# Patient Record
Sex: Female | Born: 1955 | Race: Black or African American | Hispanic: No
Health system: Southern US, Community
[De-identification: ages and names within clinical notes are randomized; demographics above are authoritative.]

## PROBLEM LIST (undated history)

## (undated) DIAGNOSIS — J45909 Unspecified asthma, uncomplicated: Secondary | ICD-10-CM

## (undated) DIAGNOSIS — I10 Essential (primary) hypertension: Secondary | ICD-10-CM

## (undated) DIAGNOSIS — G629 Polyneuropathy, unspecified: Secondary | ICD-10-CM

## (undated) DIAGNOSIS — M4316 Spondylolisthesis, lumbar region: Secondary | ICD-10-CM

## (undated) HISTORY — PX: OTHER SURGICAL HISTORY: SHX169

---

## 2005-01-16 ENCOUNTER — Ambulatory Visit (HOSPITAL_COMMUNITY): Admission: RE | Admit: 2005-01-16 | Discharge: 2005-01-16 | Payer: Self-pay | Admitting: Family Medicine

## 2007-11-02 ENCOUNTER — Emergency Department (HOSPITAL_COMMUNITY): Admission: EM | Admit: 2007-11-02 | Discharge: 2007-11-02 | Payer: Self-pay | Admitting: Emergency Medicine

## 2011-02-18 ENCOUNTER — Other Ambulatory Visit (HOSPITAL_COMMUNITY): Payer: Self-pay | Admitting: Family Medicine

## 2011-02-18 DIAGNOSIS — Z139 Encounter for screening, unspecified: Secondary | ICD-10-CM

## 2011-02-25 ENCOUNTER — Ambulatory Visit (HOSPITAL_COMMUNITY)
Admission: RE | Admit: 2011-02-25 | Discharge: 2011-02-25 | Disposition: A | Discharge status: Home / Self Care | Payer: BC Managed Care – PPO | Source: Ambulatory Visit | Attending: Family Medicine | Admitting: Family Medicine

## 2011-02-25 DIAGNOSIS — Z1231 Encounter for screening mammogram for malignant neoplasm of breast: Secondary | ICD-10-CM | POA: Insufficient documentation

## 2011-02-25 DIAGNOSIS — Z139 Encounter for screening, unspecified: Secondary | ICD-10-CM

## 2011-02-26 ENCOUNTER — Ambulatory Visit (HOSPITAL_COMMUNITY): Payer: Self-pay

## 2012-03-24 ENCOUNTER — Other Ambulatory Visit (HOSPITAL_COMMUNITY): Payer: Self-pay | Admitting: Family Medicine

## 2012-03-24 DIAGNOSIS — Z139 Encounter for screening, unspecified: Secondary | ICD-10-CM

## 2012-03-30 ENCOUNTER — Ambulatory Visit (HOSPITAL_COMMUNITY): Payer: BC Managed Care – PPO

## 2012-04-03 ENCOUNTER — Ambulatory Visit (HOSPITAL_COMMUNITY)
Admission: RE | Admit: 2012-04-03 | Discharge: 2012-04-03 | Disposition: A | Discharge status: Home / Self Care | Payer: BC Managed Care – PPO | Source: Ambulatory Visit | Attending: Family Medicine | Admitting: Family Medicine

## 2012-04-03 DIAGNOSIS — Z1231 Encounter for screening mammogram for malignant neoplasm of breast: Secondary | ICD-10-CM | POA: Insufficient documentation

## 2012-04-03 DIAGNOSIS — Z139 Encounter for screening, unspecified: Secondary | ICD-10-CM

## 2012-06-23 ENCOUNTER — Other Ambulatory Visit (HOSPITAL_COMMUNITY): Payer: Self-pay | Admitting: Family Medicine

## 2012-06-23 ENCOUNTER — Ambulatory Visit (HOSPITAL_COMMUNITY)
Admission: RE | Admit: 2012-06-23 | Discharge: 2012-06-23 | Disposition: A | Discharge status: Home / Self Care | Payer: BC Managed Care – PPO | Source: Ambulatory Visit | Attending: Family Medicine | Admitting: Family Medicine

## 2012-06-23 DIAGNOSIS — M25549 Pain in joints of unspecified hand: Secondary | ICD-10-CM | POA: Insufficient documentation

## 2012-06-23 DIAGNOSIS — R609 Edema, unspecified: Secondary | ICD-10-CM

## 2012-06-23 DIAGNOSIS — M25649 Stiffness of unspecified hand, not elsewhere classified: Secondary | ICD-10-CM | POA: Insufficient documentation

## 2013-03-23 ENCOUNTER — Other Ambulatory Visit (HOSPITAL_COMMUNITY): Payer: Self-pay | Admitting: Family Medicine

## 2013-03-23 DIAGNOSIS — Z139 Encounter for screening, unspecified: Secondary | ICD-10-CM

## 2013-04-05 ENCOUNTER — Ambulatory Visit (HOSPITAL_COMMUNITY)
Admission: RE | Admit: 2013-04-05 | Discharge: 2013-04-05 | Disposition: A | Discharge status: Home / Self Care | Payer: BC Managed Care – PPO | Source: Ambulatory Visit | Attending: Family Medicine | Admitting: Family Medicine

## 2013-04-05 DIAGNOSIS — Z1231 Encounter for screening mammogram for malignant neoplasm of breast: Secondary | ICD-10-CM | POA: Insufficient documentation

## 2013-04-05 DIAGNOSIS — Z139 Encounter for screening, unspecified: Secondary | ICD-10-CM

## 2016-11-11 ENCOUNTER — Telehealth: Payer: Self-pay

## 2016-11-11 NOTE — Telephone Encounter (Signed)
[phone]  PATIENT CALLED TO SCHEDULE A TCS L/M IF SHE DOES NOT ANSWER

## 2016-11-14 ENCOUNTER — Telehealth: Payer: Self-pay

## 2016-11-14 NOTE — Telephone Encounter (Signed)
LMOM to call.

## 2016-11-14 NOTE — Telephone Encounter (Signed)
See separate triage.  

## 2016-11-25 NOTE — Telephone Encounter (Signed)
Appropriate.

## 2016-11-25 NOTE — Telephone Encounter (Signed)
Gastroenterology Pre-Procedure Review  Request Date: 11/14/2016 Requesting Physician: Dr. Selena BattenKim   PATIENT REVIEW QUESTIONS: The patient responded to the following health history questions as indicated:    1. Diabetes Melitis: no 2. Joint replacements in the past 12 months: no 3. Major health problems in the past 3 months: no 4. Has an artificial valve or MVP: YES 5. Has a defibrillator: no 6. Has been advised in past to take antibiotics in advance of a procedure like teeth cleaning: no 7. Family history of colon cancer: no  8. Alcohol Use: no 9. History of sleep apnea: no  10. History of coronary artery or other vascular stents placed within the last 12 months: no    MEDICATIONS & ALLERGIES:    Patient reports the following regarding taking any blood thinners:   Plavix? no Aspirin? no Coumadin? no Brilinta? no Xarelto? no Eliquis? no Pradaxa? no Savaysa? no Effient? no  Patient confirms/reports the following medications:  Current Outpatient Prescriptions  Medication Sig Dispense Refill  . amLODipine (NORVASC) 5 MG tablet Take 5 mg by mouth daily.    . furosemide (LASIX) 20 MG tablet Take 20 mg by mouth.    Marland Kitchen. lisinopril (PRINIVIL,ZESTRIL) 40 MG tablet Take 40 mg by mouth daily.     No current facility-administered medications for this visit.     Patient confirms/reports the following allergies:  No Known Allergies  No orders of the defined types were placed in this encounter.   AUTHORIZATION INFORMATION Primary Insurance:  ID #: Group #:  Pre-Cert / Auth required: Pre-Cert / Auth #:   Secondary Insurance:  ID #:   Group #:  Pre-Cert / Auth required:  Pre-Cert / Auth #:   SCHEDULE INFORMATION: Procedure has been scheduled as follows:  Date:                        Time:   Location:   This Gastroenterology Pre-Precedure Review Form is being routed to the following provider(s): R. Roetta SessionsMichael Rourk, MD

## 2016-11-26 ENCOUNTER — Other Ambulatory Visit: Payer: Self-pay

## 2016-11-26 NOTE — Telephone Encounter (Signed)
LMOM to call back

## 2016-11-26 NOTE — Telephone Encounter (Signed)
Forwarding to Ginger to complete.

## 2016-11-27 NOTE — Telephone Encounter (Signed)
Pt called back but she does not have her schedule for April so she will call us back tomorrow

## 2016-11-28 ENCOUNTER — Other Ambulatory Visit: Payer: Self-pay

## 2016-11-28 DIAGNOSIS — Z1211 Encounter for screening for malignant neoplasm of colon: Secondary | ICD-10-CM

## 2016-11-28 MED ORDER — PEG 3350-KCL-NA BICARB-NACL 420 G PO SOLR: 4000.0000 mL | ORAL | 0 refills | Status: DC

## 2016-11-28 NOTE — Telephone Encounter (Signed)
Pt is set up for TCS on 01/16/17 @ 12:00 pm. She is aware of appointment. NO PA is needed

## 2016-12-04 ENCOUNTER — Ambulatory Visit (INDEPENDENT_AMBULATORY_CARE_PROVIDER_SITE_OTHER): Payer: BLUE CROSS/BLUE SHIELD | Admitting: Urology

## 2016-12-04 DIAGNOSIS — R1084 Generalized abdominal pain: Secondary | ICD-10-CM

## 2016-12-06 ENCOUNTER — Other Ambulatory Visit (HOSPITAL_COMMUNITY): Payer: Self-pay | Admitting: Internal Medicine

## 2016-12-06 DIAGNOSIS — R109 Unspecified abdominal pain: Secondary | ICD-10-CM

## 2016-12-11 ENCOUNTER — Ambulatory Visit (HOSPITAL_COMMUNITY)
Admission: RE | Admit: 2016-12-11 | Discharge: 2016-12-11 | Disposition: A | Discharge status: Home / Self Care | Payer: BLUE CROSS/BLUE SHIELD | Source: Ambulatory Visit | Attending: Internal Medicine | Admitting: Internal Medicine

## 2016-12-11 DIAGNOSIS — N1330 Unspecified hydronephrosis: Secondary | ICD-10-CM | POA: Diagnosis not present

## 2016-12-11 DIAGNOSIS — R109 Unspecified abdominal pain: Secondary | ICD-10-CM | POA: Insufficient documentation

## 2017-01-08 ENCOUNTER — Ambulatory Visit (INDEPENDENT_AMBULATORY_CARE_PROVIDER_SITE_OTHER): Payer: BLUE CROSS/BLUE SHIELD | Admitting: Urology

## 2017-01-08 ENCOUNTER — Ambulatory Visit (HOSPITAL_COMMUNITY)
Admission: RE | Admit: 2017-01-08 | Discharge: 2017-01-08 | Disposition: A | Discharge status: Home / Self Care | Payer: BLUE CROSS/BLUE SHIELD | Source: Ambulatory Visit | Attending: Urology | Admitting: Urology

## 2017-01-08 ENCOUNTER — Other Ambulatory Visit: Payer: Self-pay | Admitting: Urology

## 2017-01-08 DIAGNOSIS — N1339 Other hydronephrosis: Secondary | ICD-10-CM

## 2017-01-08 DIAGNOSIS — K769 Liver disease, unspecified: Secondary | ICD-10-CM | POA: Insufficient documentation

## 2017-01-08 DIAGNOSIS — R109 Unspecified abdominal pain: Secondary | ICD-10-CM | POA: Diagnosis not present

## 2017-01-08 DIAGNOSIS — R1084 Generalized abdominal pain: Secondary | ICD-10-CM | POA: Diagnosis not present

## 2017-01-14 ENCOUNTER — Telehealth: Payer: Self-pay

## 2017-01-14 NOTE — Telephone Encounter (Signed)
No changes in her medications for up coming TCS.

## 2017-01-14 NOTE — Telephone Encounter (Signed)
LMOM to call back

## 2017-01-16 ENCOUNTER — Encounter (HOSPITAL_COMMUNITY): Payer: Self-pay | Admitting: *Deleted

## 2017-01-16 ENCOUNTER — Ambulatory Visit (HOSPITAL_COMMUNITY)
Admission: RE | Admit: 2017-01-16 | Discharge: 2017-01-16 | Disposition: A | Discharge status: Home / Self Care | Payer: BLUE CROSS/BLUE SHIELD | Source: Ambulatory Visit | Attending: Internal Medicine | Admitting: Internal Medicine

## 2017-01-16 ENCOUNTER — Encounter (HOSPITAL_COMMUNITY)
Admission: RE | Disposition: A | Discharge status: Home / Self Care | Payer: Self-pay | Source: Ambulatory Visit | Attending: Internal Medicine

## 2017-01-16 DIAGNOSIS — K5730 Diverticulosis of large intestine without perforation or abscess without bleeding: Secondary | ICD-10-CM | POA: Insufficient documentation

## 2017-01-16 DIAGNOSIS — Z7982 Long term (current) use of aspirin: Secondary | ICD-10-CM | POA: Insufficient documentation

## 2017-01-16 DIAGNOSIS — Z79899 Other long term (current) drug therapy: Secondary | ICD-10-CM | POA: Diagnosis not present

## 2017-01-16 DIAGNOSIS — Z800 Family history of malignant neoplasm of digestive organs: Secondary | ICD-10-CM | POA: Insufficient documentation

## 2017-01-16 DIAGNOSIS — Z1212 Encounter for screening for malignant neoplasm of rectum: Secondary | ICD-10-CM | POA: Diagnosis not present

## 2017-01-16 DIAGNOSIS — I10 Essential (primary) hypertension: Secondary | ICD-10-CM | POA: Diagnosis not present

## 2017-01-16 DIAGNOSIS — Z1211 Encounter for screening for malignant neoplasm of colon: Secondary | ICD-10-CM | POA: Insufficient documentation

## 2017-01-16 DIAGNOSIS — J45909 Unspecified asthma, uncomplicated: Secondary | ICD-10-CM | POA: Diagnosis not present

## 2017-01-16 HISTORY — PX: COLONOSCOPY: SHX5424

## 2017-01-16 HISTORY — DX: Essential (primary) hypertension: I10

## 2017-01-16 HISTORY — DX: Unspecified asthma, uncomplicated: J45.909

## 2017-01-16 SURGERY — COLONOSCOPY
Anesthesia: Moderate Sedation

## 2017-01-16 MED ORDER — MEPERIDINE HCL 100 MG/ML IJ SOLN
INTRAMUSCULAR | Status: AC
  Filled 2017-01-16: qty 2

## 2017-01-16 MED ORDER — MIDAZOLAM HCL 5 MG/5ML IJ SOLN
INTRAMUSCULAR | Status: DC | PRN
  Administered 2017-01-16: 2 mg via INTRAVENOUS
  Administered 2017-01-16: 1 mg via INTRAVENOUS
  Administered 2017-01-16: 2 mg via INTRAVENOUS

## 2017-01-16 MED ORDER — SODIUM CHLORIDE 0.9 % IV SOLN
INTRAVENOUS | Status: DC
  Administered 2017-01-16: 11:00:00 via INTRAVENOUS

## 2017-01-16 MED ORDER — MEPERIDINE HCL 100 MG/ML IJ SOLN
INTRAMUSCULAR | Status: DC | PRN
  Administered 2017-01-16: 50 mg via INTRAVENOUS
  Administered 2017-01-16: 25 mg via INTRAVENOUS
  Administered 2017-01-16: 50 mg via INTRAVENOUS

## 2017-01-16 MED ORDER — ONDANSETRON HCL 4 MG/2ML IJ SOLN
INTRAMUSCULAR | Status: AC
  Filled 2017-01-16: qty 2

## 2017-01-16 MED ORDER — STERILE WATER FOR IRRIGATION IR SOLN
Status: DC | PRN
  Administered 2017-01-16: 12:00:00

## 2017-01-16 MED ORDER — MIDAZOLAM HCL 5 MG/5ML IJ SOLN
INTRAMUSCULAR | Status: DC
Start: 2017-01-16 — End: 2017-01-16
  Filled 2017-01-16: qty 10

## 2017-01-16 MED ORDER — ONDANSETRON HCL 4 MG/2ML IJ SOLN
INTRAMUSCULAR | Status: DC | PRN
  Administered 2017-01-16: 4 mg via INTRAVENOUS

## 2017-01-16 NOTE — Discharge Instructions (Addendum)

## 2017-01-16 NOTE — Op Note (Signed)
[facility] Patient Name: Sarah Woodward Procedure Date: 01/16/2017 11:25 AM MRN: [id number] Date of Birth: 09/21/1957 Attending MD: Gennette Pac , MD CSN: [id number] Age: 61 Admit Type: Outpatient Procedure:                Colonoscopy Indications:              Screening for colorectal malignant neoplasm Providers:                Gennette Pac, MD, Criselda Peaches. Patsy Lager, RN,                            Burke Keels, Technician Referring MD:              Medicines:                Midazolam 5 mg IV, Meperidine 125 mg IV Complications:            No immediate complications. Estimated Blood Loss:     Estimated blood loss: none. Procedure:                Pre-Anesthesia Assessment:                           - Prior to the procedure, a History and Physical                            was performed, and patient medications and                            allergies were reviewed. The patient's tolerance of                            previous anesthesia was also reviewed. The risks                            and benefits of the procedure and the sedation                            options and risks were discussed with the patient.                            All questions were answered, and informed consent                            was obtained. Prior Anticoagulants: The patient has                            taken no previous anticoagulant or antiplatelet                            agents. ASA Grade Assessment: II - A patient with                            mild systemic disease. After reviewing the risks  and benefits, the patient was deemed in                            satisfactory condition to undergo the procedure.                           After obtaining informed consent, the colonoscope                            was passed under direct vision. Throughout the                            procedure, the patient's blood pressure, pulse, and                          oxygen saturations were monitored continuously. The                            EC-[id number] ([id number]) scope was introduced through                            the anus and advanced to the the cecum, identified                            by appendiceal orifice and ileocecal valve. The                            colonoscopy was performed without difficulty. The                            patient tolerated the procedure well. The quality                            of the bowel preparation was adequate. The entire                            colon was well visualized. The ileocecal valve,                            appendiceal orifice, and rectum were photographed.                            The colonoscopy was performed without difficulty.                            The quality of the bowel preparation was adequate. Scope In: 11:40:31 AM Scope Out: 11:55:45 AM Scope Withdrawal Time: 0 hours 7 minutes 5 seconds  Total Procedure Duration: 0 hours 15 minutes 14 seconds  Findings:      The perianal and digital rectal examinations were normal.      Multiple small and large-mouthed diverticula were found in the entire       colon.      The exam was otherwise without abnormality on direct and retroflexion       views. Impression:               -  Diverticulosis in the entire examined colon.                           - The examination was otherwise normal on direct                            and retroflexion views.                           - No specimens collected. Moderate Sedation:      Moderate (conscious) sedation was administered by the endoscopy nurse       and supervised by the endoscopist. The following parameters were       monitored: oxygen saturation, heart rate, blood pressure, respiratory       rate, EKG, adequacy of pulmonary ventilation, and response to care.       Total physician intraservice time was 21 minutes. Recommendation:           - Patient has a contact  number available for                            emergencies. The signs and symptoms of potential                            delayed complications were discussed with the                            patient. Return to normal activities tomorrow.                            Written discharge instructions were provided to the                            patient.                           - Resume previous diet.                           - Continue present medications.                           - Repeat colonoscopy in 10 years for screening                            purposes.                           - Return to GI clinic PRN. Procedure Code(s):        --- Professional ---                           [phone], Colonoscopy, flexible; diagnostic, including                            collection of specimen(s) by brushing or washing,  when performed (separate procedure)                           99152, Moderate sedation services provided by the                            same physician or other qualified health care                            professional performing the diagnostic or                            therapeutic service that the sedation supports,                            requiring the presence of an independent trained                            observer to assist in the monitoring of the                            patient's level of consciousness and physiological                            status; initial 15 minutes of intraservice time,                            patient age 61 years or older Diagnosis Code(s):        --- Professional ---                           Z12.11, Encounter for screening for malignant                            neoplasm of colon                           K57.30, Diverticulosis of large intestine without                            perforation or abscess without bleeding CPT copyright 2016 American Medical Association. All rights  reserved. The codes documented in this report are preliminary and upon coder review may  be revised to meet current compliance requirements. Gerrit Friends. Jamiya Nims, MD Gennette Pac, MD 01/16/2017 12:03:14 PM This report has been signed electronically. Number of Addenda: 0

## 2017-01-16 NOTE — H&P (Signed)
 @   Primary Care Physician:  Nida Boatman, MD Primary Gastroenterologist:  Dr. Jena Gauss  Pre-Procedure History & Physical: HPI:  Sarah Woodward is a 61 y.o. female is here for a screening colonoscopy.  No prior colonoscopy. Family history of colon cancer. No bowel symptoms.   Past Medical History:  Diagnosis Date  . Asthma   . Hypertension     Past Surgical History:  Procedure Laterality Date  . left thumb surgery      Prior to Admission medications   Medication Sig Start Date End Date Taking? Authorizing Provider  acetaminophen (TYLENOL) 500 MG tablet Take 1,000 mg by mouth daily as needed for mild pain.   Yes Historical Provider, MD  aspirin 325 MG tablet Take 325 mg by mouth daily.   Yes Historical Provider, MD  Biotin 5000 MCG TABS Take 5,000 mcg by mouth daily.   Yes Historical Provider, MD  ferrous sulfate (IRON SUPPLEMENT) 325 (65 FE) MG tablet Take 325 mg by mouth daily with breakfast.   Yes Historical Provider, MD  furosemide (LASIX) 40 MG tablet Take 40 mg by mouth daily. 11/26/16  Yes Historical Provider, MD  Garlic 1000 MG CAPS Take 1,000 mg by mouth daily.   Yes Historical Provider, MD  Boris Lown Oil 350 MG CAPS Take 350 mg by mouth daily.   Yes Historical Provider, MD  lisinopril (PRINIVIL,ZESTRIL) 40 MG tablet Take 40 mg by mouth daily.   Yes Historical Provider, MD  polyethylene glycol-electrolytes (TRILYTE) 420 g solution Take 4,000 mLs by mouth as directed. 11/28/16  Yes Corbin Ade, MD  VITAMIN A PO Take 1 tablet by mouth daily.   Yes Historical Provider, MD  vitamin B-12 (CYANOCOBALAMIN) 1000 MCG tablet Take 1,000 mcg by mouth daily.   Yes Historical Provider, MD  vitamin C (ASCORBIC ACID) 500 MG tablet Take 500 mg by mouth daily.   Yes Historical Provider, MD  vitamin E 400 UNIT capsule Take 400 Units by mouth daily.   Yes Historical Provider, MD  nabumetone (RELAFEN) 750 MG tablet Take 750 mg by mouth 2 (two) times daily as needed for pain. 12/23/16    Historical Provider, MD    Allergies as of 11/28/2016  . (No Known Allergies)    Family History  Problem Relation Age of Onset  . Colon cancer Neg Hx     Social History   Social History  . Marital status: Widowed    Spouse name: N/A  . Number of children: N/A  . Years of education: N/A   Occupational History  . Not on file.   Social History Main Topics  . Smoking status: Never Smoker  . Smokeless tobacco: Never Used  . Alcohol use No  . Drug use: No  . Sexual activity: Not on file   Other Topics Concern  . Not on file   Social History Narrative  . No narrative on file    Review of Systems: See HPI, otherwise negative ROS  Physical Exam: BP (!) 163/95   Pulse 75   Temp 98.5 F (36.9 C) (Oral)   Resp (!) 21   Ht  (1.702 m)   Wt 163 lb (73.9 kg)   SpO2 100%   BMI 25.53 kg/m  General:   Alert,  Well-developed, well-nourished, pleasant and cooperative in NAD Lungs:  Clear throughout to auscultation.   No wheezes, crackles, or rhonchi. No acute distress. Heart:  Regular rate and rhythm; no murmurs, clicks, rubs,  or gallops. Abdomen:  Soft, nontender and nondistended. No masses, hepatosplenomegaly or hernias noted. Normal bowel sounds, without guarding, and without rebound.     Impression/Plan: Sarah Woodward is now here to undergo a screening colonoscopy.  First ever average risk screening examination.  Risks, benefits, limitations, imponderables and alternatives regarding colonoscopy have been reviewed with the patient. Questions have been answered. All parties agreeable.     Notice:  This dictation was prepared with Dragon dictation along with smaller phrase technology. Any transcriptional errors that result from this process are unintentional and may not be corrected upon review.

## 2017-01-20 ENCOUNTER — Encounter (HOSPITAL_COMMUNITY): Payer: Self-pay | Admitting: Internal Medicine

## 2017-02-24 ENCOUNTER — Other Ambulatory Visit (HOSPITAL_COMMUNITY): Payer: Self-pay | Admitting: Family Medicine

## 2017-02-24 ENCOUNTER — Ambulatory Visit (HOSPITAL_COMMUNITY)
Admission: RE | Admit: 2017-02-24 | Discharge: 2017-02-24 | Disposition: A | Discharge status: Home / Self Care | Payer: BLUE CROSS/BLUE SHIELD | Source: Ambulatory Visit | Attending: Family Medicine | Admitting: Family Medicine

## 2017-02-24 DIAGNOSIS — M4316 Spondylolisthesis, lumbar region: Secondary | ICD-10-CM | POA: Diagnosis not present

## 2017-02-24 DIAGNOSIS — M48061 Spinal stenosis, lumbar region without neurogenic claudication: Secondary | ICD-10-CM | POA: Diagnosis not present

## 2017-02-24 DIAGNOSIS — M5416 Radiculopathy, lumbar region: Secondary | ICD-10-CM

## 2017-03-11 ENCOUNTER — Other Ambulatory Visit: Payer: Self-pay | Admitting: Urology

## 2017-03-11 DIAGNOSIS — N1339 Other hydronephrosis: Secondary | ICD-10-CM

## 2017-03-18 ENCOUNTER — Ambulatory Visit (HOSPITAL_COMMUNITY)
Admission: RE | Admit: 2017-03-18 | Discharge: 2017-03-18 | Disposition: A | Discharge status: Home / Self Care | Payer: BLUE CROSS/BLUE SHIELD | Source: Ambulatory Visit | Attending: Urology | Admitting: Urology

## 2017-03-18 DIAGNOSIS — Z1389 Encounter for screening for other disorder: Secondary | ICD-10-CM | POA: Diagnosis not present

## 2017-03-18 DIAGNOSIS — N2889 Other specified disorders of kidney and ureter: Secondary | ICD-10-CM | POA: Diagnosis not present

## 2017-03-18 DIAGNOSIS — N1339 Other hydronephrosis: Secondary | ICD-10-CM

## 2017-04-09 ENCOUNTER — Ambulatory Visit (INDEPENDENT_AMBULATORY_CARE_PROVIDER_SITE_OTHER): Payer: BLUE CROSS/BLUE SHIELD | Admitting: Urology

## 2017-04-09 DIAGNOSIS — R1084 Generalized abdominal pain: Secondary | ICD-10-CM | POA: Diagnosis not present

## 2017-04-09 DIAGNOSIS — N1339 Other hydronephrosis: Secondary | ICD-10-CM

## 2017-05-27 ENCOUNTER — Other Ambulatory Visit: Payer: Self-pay | Admitting: Neurology

## 2017-05-27 DIAGNOSIS — G8929 Other chronic pain: Secondary | ICD-10-CM

## 2017-05-27 DIAGNOSIS — M545 Low back pain: Principal | ICD-10-CM

## 2017-06-02 ENCOUNTER — Other Ambulatory Visit (HOSPITAL_COMMUNITY): Payer: Self-pay | Admitting: Neurology

## 2017-06-02 DIAGNOSIS — M545 Low back pain: Secondary | ICD-10-CM

## 2017-06-02 DIAGNOSIS — R29898 Other symptoms and signs involving the musculoskeletal system: Secondary | ICD-10-CM

## 2017-06-12 ENCOUNTER — Other Ambulatory Visit (HOSPITAL_COMMUNITY): Payer: Self-pay | Admitting: Family Medicine

## 2017-06-12 DIAGNOSIS — Z1231 Encounter for screening mammogram for malignant neoplasm of breast: Secondary | ICD-10-CM

## 2017-06-18 ENCOUNTER — Ambulatory Visit (HOSPITAL_COMMUNITY)
Admission: RE | Admit: 2017-06-18 | Discharge: 2017-06-18 | Disposition: A | Discharge status: Home / Self Care | Payer: BLUE CROSS/BLUE SHIELD | Source: Ambulatory Visit | Attending: Family Medicine | Admitting: Family Medicine

## 2017-06-18 DIAGNOSIS — Z1231 Encounter for screening mammogram for malignant neoplasm of breast: Secondary | ICD-10-CM | POA: Diagnosis not present

## 2017-06-18 DIAGNOSIS — R928 Other abnormal and inconclusive findings on diagnostic imaging of breast: Secondary | ICD-10-CM | POA: Insufficient documentation

## 2017-06-23 ENCOUNTER — Other Ambulatory Visit (HOSPITAL_COMMUNITY): Payer: Self-pay | Admitting: Family Medicine

## 2017-06-23 DIAGNOSIS — R928 Other abnormal and inconclusive findings on diagnostic imaging of breast: Secondary | ICD-10-CM

## 2017-07-01 ENCOUNTER — Ambulatory Visit (HOSPITAL_COMMUNITY)
Admission: RE | Admit: 2017-07-01 | Discharge: 2017-07-01 | Disposition: A | Discharge status: Home / Self Care | Payer: BLUE CROSS/BLUE SHIELD | Source: Ambulatory Visit | Attending: Family Medicine | Admitting: Family Medicine

## 2017-07-01 DIAGNOSIS — R928 Other abnormal and inconclusive findings on diagnostic imaging of breast: Secondary | ICD-10-CM

## 2017-07-17 ENCOUNTER — Other Ambulatory Visit: Payer: Self-pay | Admitting: Neurology

## 2017-07-17 DIAGNOSIS — M5416 Radiculopathy, lumbar region: Secondary | ICD-10-CM

## 2017-07-23 ENCOUNTER — Ambulatory Visit (HOSPITAL_COMMUNITY)
Admission: RE | Admit: 2017-07-23 | Discharge: 2017-07-23 | Disposition: A | Discharge status: Home / Self Care | Payer: BLUE CROSS/BLUE SHIELD | Source: Ambulatory Visit | Attending: Neurology | Admitting: Neurology

## 2017-07-23 DIAGNOSIS — M47816 Spondylosis without myelopathy or radiculopathy, lumbar region: Secondary | ICD-10-CM | POA: Insufficient documentation

## 2017-07-23 DIAGNOSIS — M48061 Spinal stenosis, lumbar region without neurogenic claudication: Secondary | ICD-10-CM | POA: Diagnosis not present

## 2017-07-23 DIAGNOSIS — M545 Low back pain: Secondary | ICD-10-CM | POA: Insufficient documentation

## 2017-07-23 DIAGNOSIS — R531 Weakness: Secondary | ICD-10-CM | POA: Insufficient documentation

## 2017-07-23 DIAGNOSIS — R29898 Other symptoms and signs involving the musculoskeletal system: Secondary | ICD-10-CM

## 2017-07-23 DIAGNOSIS — M4316 Spondylolisthesis, lumbar region: Secondary | ICD-10-CM | POA: Insufficient documentation

## 2017-07-25 ENCOUNTER — Ambulatory Visit
Admission: RE | Admit: 2017-07-25 | Discharge: 2017-07-25 | Disposition: A | Discharge status: Home / Self Care | Payer: BLUE CROSS/BLUE SHIELD | Source: Ambulatory Visit | Attending: Neurology | Admitting: Neurology

## 2017-07-25 DIAGNOSIS — M5416 Radiculopathy, lumbar region: Secondary | ICD-10-CM

## 2017-07-25 MED ORDER — HYDROCODONE-ACETAMINOPHEN 5-325 MG PO TABS
1.0000 | ORAL_TABLET | Freq: Once | ORAL | Status: AC
  Administered 2017-07-25: 1 via ORAL

## 2017-07-25 MED ORDER — METHYLPREDNISOLONE ACETATE 40 MG/ML INJ SUSP (RADIOLOG
120.0000 mg | Freq: Once | INTRAMUSCULAR | Status: AC
  Administered 2017-07-25: 120 mg via EPIDURAL

## 2017-07-25 MED ORDER — IOPAMIDOL (ISOVUE-M 200) INJECTION 41%
1.0000 mL | Freq: Once | INTRAMUSCULAR | Status: AC
  Administered 2017-07-25: 1 mL via EPIDURAL

## 2017-07-25 NOTE — Discharge Instructions (Signed)

## 2017-10-15 ENCOUNTER — Other Ambulatory Visit: Payer: Self-pay | Admitting: Neurology

## 2017-10-15 DIAGNOSIS — M5416 Radiculopathy, lumbar region: Secondary | ICD-10-CM

## 2017-10-24 ENCOUNTER — Ambulatory Visit
Admission: RE | Admit: 2017-10-24 | Discharge: 2017-10-24 | Disposition: A | Discharge status: Home / Self Care | Payer: BLUE CROSS/BLUE SHIELD | Source: Ambulatory Visit | Attending: Neurology | Admitting: Neurology

## 2017-10-24 DIAGNOSIS — M5416 Radiculopathy, lumbar region: Secondary | ICD-10-CM

## 2017-10-24 MED ORDER — METHYLPREDNISOLONE ACETATE 40 MG/ML INJ SUSP (RADIOLOG
120.0000 mg | Freq: Once | INTRAMUSCULAR | Status: AC
  Administered 2017-10-24: 120 mg via EPIDURAL

## 2017-10-24 MED ORDER — IOPAMIDOL (ISOVUE-M 200) INJECTION 41%
1.0000 mL | Freq: Once | INTRAMUSCULAR | Status: AC
  Administered 2017-10-24: 1 mL via EPIDURAL

## 2017-10-24 NOTE — Discharge Instructions (Signed)

## 2018-01-14 ENCOUNTER — Other Ambulatory Visit: Payer: Self-pay | Admitting: Neurology

## 2018-01-14 DIAGNOSIS — M545 Low back pain: Principal | ICD-10-CM

## 2018-01-14 DIAGNOSIS — G8929 Other chronic pain: Secondary | ICD-10-CM

## 2018-01-26 ENCOUNTER — Ambulatory Visit
Admission: RE | Admit: 2018-01-26 | Discharge: 2018-01-26 | Disposition: A | Discharge status: Home / Self Care | Payer: BLUE CROSS/BLUE SHIELD | Source: Ambulatory Visit | Attending: Neurology | Admitting: Neurology

## 2018-01-26 DIAGNOSIS — M545 Low back pain: Principal | ICD-10-CM

## 2018-01-26 DIAGNOSIS — G8929 Other chronic pain: Secondary | ICD-10-CM

## 2018-01-26 MED ORDER — METHYLPREDNISOLONE ACETATE 40 MG/ML INJ SUSP (RADIOLOG
120.0000 mg | Freq: Once | INTRAMUSCULAR | Status: AC
  Administered 2018-01-26: 120 mg via EPIDURAL

## 2018-01-26 MED ORDER — IOPAMIDOL (ISOVUE-M 200) INJECTION 41%
1.0000 mL | Freq: Once | INTRAMUSCULAR | Status: AC
  Administered 2018-01-26: 1 mL via EPIDURAL

## 2018-01-26 NOTE — Discharge Instructions (Signed)

## 2018-02-12 ENCOUNTER — Ambulatory Visit (INDEPENDENT_AMBULATORY_CARE_PROVIDER_SITE_OTHER): Payer: BLUE CROSS/BLUE SHIELD | Admitting: Podiatry

## 2018-02-12 ENCOUNTER — Ambulatory Visit (INDEPENDENT_AMBULATORY_CARE_PROVIDER_SITE_OTHER): Payer: BLUE CROSS/BLUE SHIELD

## 2018-02-12 ENCOUNTER — Encounter: Payer: Self-pay | Admitting: Podiatry

## 2018-02-12 ENCOUNTER — Other Ambulatory Visit: Payer: Self-pay

## 2018-02-12 ENCOUNTER — Other Ambulatory Visit: Payer: Self-pay | Admitting: Podiatry

## 2018-02-12 VITALS — BP 156/92 | HR 70

## 2018-02-12 DIAGNOSIS — M778 Other enthesopathies, not elsewhere classified: Secondary | ICD-10-CM

## 2018-02-12 DIAGNOSIS — M79672 Pain in left foot: Secondary | ICD-10-CM | POA: Diagnosis not present

## 2018-02-12 DIAGNOSIS — M79671 Pain in right foot: Secondary | ICD-10-CM

## 2018-02-12 DIAGNOSIS — M779 Enthesopathy, unspecified: Principal | ICD-10-CM

## 2018-02-12 DIAGNOSIS — M21619 Bunion of unspecified foot: Secondary | ICD-10-CM | POA: Diagnosis not present

## 2018-02-12 NOTE — Progress Notes (Signed)
Subjective:   Patient ID: Sarah Woodward, female   DOB: 62 y.o.   MRN: [id number]   HPI Patient presents stating I have bad feet on on my feet all day at work and I have lesions underneath them that are making it hard for me to be active.  Patient states she tries to walk but it is difficult and she does have neuropathy and takes gabapentin   Review of Systems  All other systems reviewed and are negative.       Objective:  Physical Exam  Constitutional: She appears well-developed and well-nourished.  Cardiovascular: Intact distal pulses.  Pulmonary/Chest: Effort normal.  Musculoskeletal: Normal range of motion.  Neurological: She is alert.  Skin: Skin is warm.  Nursing note and vitals reviewed.   Neurovascular status intact muscle strength is adequate range of motion within normal limits with patient found to have significant structural bunion deformity bilateral plantarflexed second metatarsals bilateral with inflammatory capsulitis keratotic lesion formation and overall neuropathic-like symptomatology bilateral.  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  Patient is scanned by her ped orthotist for orthotics to reduce all pressure against the second metatarsal bilateral will be seen back in May ultimately require foot correction but we will try to hold off on this and treat her conservatively  X-rays indicate structural bunion deformity of a significant nature bilateral with digital deformities also noted

## 2018-03-09 ENCOUNTER — Ambulatory Visit (INDEPENDENT_AMBULATORY_CARE_PROVIDER_SITE_OTHER): Payer: BLUE CROSS/BLUE SHIELD | Admitting: Orthotics

## 2018-03-09 DIAGNOSIS — M779 Enthesopathy, unspecified: Secondary | ICD-10-CM

## 2018-03-09 NOTE — Progress Notes (Signed)
Patient came in today to p/up functional foot orthotics.   The orthotics were assessed to both fit and function.  The F/O addressed the biomechanical issues/pathologies as intended, offering good longitudinal arch support, proper offloading, and foot support. There weren't any signs of discomfort or irritation.  The F/O fit properly in footwear with minimal trimming/adjustments. 

## 2018-03-16 ENCOUNTER — Other Ambulatory Visit: Payer: Self-pay | Admitting: Urology

## 2018-03-16 DIAGNOSIS — N1339 Other hydronephrosis: Secondary | ICD-10-CM

## 2018-03-24 ENCOUNTER — Ambulatory Visit (HOSPITAL_COMMUNITY)
Admission: RE | Admit: 2018-03-24 | Discharge: 2018-03-24 | Disposition: A | Discharge status: Home / Self Care | Payer: BLUE CROSS/BLUE SHIELD | Source: Ambulatory Visit | Attending: Urology | Admitting: Urology

## 2018-03-24 DIAGNOSIS — N1339 Other hydronephrosis: Secondary | ICD-10-CM | POA: Diagnosis present

## 2018-03-24 DIAGNOSIS — N1330 Unspecified hydronephrosis: Secondary | ICD-10-CM | POA: Insufficient documentation

## 2018-04-06 ENCOUNTER — Other Ambulatory Visit: Payer: Self-pay | Admitting: Neurosurgery

## 2018-04-17 ENCOUNTER — Encounter (HOSPITAL_COMMUNITY)
Admission: RE | Admit: 2018-04-17 | Discharge: 2018-04-17 | Disposition: A | Discharge status: Home / Self Care | Payer: BLUE CROSS/BLUE SHIELD | Source: Ambulatory Visit | Attending: Neurosurgery | Admitting: Neurosurgery

## 2018-04-17 ENCOUNTER — Other Ambulatory Visit: Payer: Self-pay

## 2018-04-17 ENCOUNTER — Encounter (HOSPITAL_COMMUNITY): Payer: Self-pay

## 2018-04-17 DIAGNOSIS — R001 Bradycardia, unspecified: Secondary | ICD-10-CM | POA: Insufficient documentation

## 2018-04-17 DIAGNOSIS — Z01812 Encounter for preprocedural laboratory examination: Secondary | ICD-10-CM | POA: Diagnosis present

## 2018-04-17 DIAGNOSIS — Z01810 Encounter for preprocedural cardiovascular examination: Secondary | ICD-10-CM | POA: Insufficient documentation

## 2018-04-17 HISTORY — DX: Spondylolisthesis, lumbar region: M43.16

## 2018-04-17 LAB — CBC WITH DIFFERENTIAL/PLATELET
Abs Immature Granulocytes: 0 10*3/uL (ref 0.0–0.1)
Basophils Absolute: 0 10*3/uL (ref 0.0–0.1)
Basophils Relative: 1 %
Eosinophils Absolute: 0.1 10*3/uL (ref 0.0–0.7)
Eosinophils Relative: 4 %
HCT: 38.1 % (ref 36.0–46.0)
Hemoglobin: 11.7 g/dL — ABNORMAL LOW (ref 12.0–15.0)
IMMATURE GRANULOCYTES: 0 %
LYMPHS ABS: 1.3 10*3/uL (ref 0.7–4.0)
Lymphocytes Relative: 39 %
MCH: 29.3 pg (ref 26.0–34.0)
MCHC: 30.7 g/dL (ref 30.0–36.0)
MCV: 95.3 fL (ref 78.0–100.0)
Monocytes Absolute: 0.4 10*3/uL (ref 0.1–1.0)
Monocytes Relative: 12 %
NEUTROS ABS: 1.5 10*3/uL — AB (ref 1.7–7.7)
NEUTROS PCT: 44 %
Platelets: 237 10*3/uL (ref 150–400)
RBC: 4 MIL/uL (ref 3.87–5.11)
RDW: 13 % (ref 11.5–15.5)
WBC: 3.3 10*3/uL — ABNORMAL LOW (ref 4.0–10.5)

## 2018-04-17 LAB — SURGICAL PCR SCREEN
MRSA, PCR: NEGATIVE
STAPHYLOCOCCUS AUREUS: POSITIVE — AB

## 2018-04-17 LAB — BASIC METABOLIC PANEL
Anion gap: 9 (ref 5–15)
BUN: 25 mg/dL — ABNORMAL HIGH (ref 6–20)
CALCIUM: 9.4 mg/dL (ref 8.9–10.3)
CO2: 29 mmol/L (ref 22–32)
CREATININE: 1.24 mg/dL — AB (ref 0.44–1.00)
Chloride: 102 mmol/L (ref 98–111)
GFR calc Af Amer: 54 mL/min — ABNORMAL LOW (ref >60)
GFR calc non Af Amer: 47 mL/min — ABNORMAL LOW (ref >60)
GLUCOSE: 65 mg/dL — AB (ref 70–99)
Potassium: 4.2 mmol/L (ref 3.5–5.1)
Sodium: 140 mmol/L (ref 135–145)

## 2018-04-17 LAB — ABO/RH: ABO/RH(D): B POS

## 2018-04-17 NOTE — Progress Notes (Signed)
ADAISHA CAMPISE            04/17/2018                          [facility] - [place], [place] - [address] [place] [address] Phone: [phone] Fax: [phone]             Your procedure is scheduled on Tues., April 21, 2018 from 11:18AM-2:59PM            Report to [facility] Admitting  Entrance "A" at 9:15AM             Call this number if you have problems the morning of surgery: [phone]   This is the number for the Pre- Surgical Desk.             Remember:            Do not eat or drink after midnight Monday, July 29.             Take these medicines the morning of surgery with A SIP OF WATER :  atorvastatin (LIPITOR)  gabapentin (NEURONTIN) DULoxetine (CYMBALTA)  If needed: tylenol and tramadol  Follow your surgeon's instructions regarding holding or continuing Aspirin.  As of today, stop taking Aspirin Products (Goody Powder, Excedrin Migraine), Ibuprofen (Advil), Naproxen (Aleve), nabumetone (RELAFEN) Vitamins and Herbal Products (ie Fish Oil).                             Do not wear jewelry, make-up or nail polish.                             Do not wear lotions, powders, or perfumes, or deodorant.                             Do not shave 48 hours prior to surgery.                             Do not bring valuables to the hospital [facility] is not responsible for any belongings or valuables. Contacts, dentures or bridgework may not be worn into surgery.  Leave your suitcase in the car.  After surgery it may be brought to your room.  For patients admitted to the hospital, discharge time will be determined by your treatment team.   Special Instructions:   [facility]- Preparing For Surgery  Before surgery, you can play an important role. Because skin is not sterile, your skin needs to be as free of germs as possible. You can reduce the number of germs on your skin by washing with CHG (chlorahexidine  gluconate) Soap before surgery.  CHG is an antiseptic cleaner which kills germs and bonds with the skin to continue killing germs even after washing.    Oral Hygiene is also important to reduce your risk of infection.  Remember - BRUSH YOUR TEETH THE MORNING OF SURGERY WITH YOUR REGULAR TOOTHPASTE  Please do not use if you have an allergy to CHG or antibacterial soaps. If your skin becomes reddened/irritated stop using the CHG.  Do not shave (including legs and underarms) for at least 48 hours prior to first CHG shower. It is OK to shave  your face.  Please follow these instructions carefully.                                                                                                                     1. Shower the NIGHT BEFORE SURGERY and the MORNING OF SURGERY with CHG.   2. If you chose to wash your hair, wash your hair first as usual with your normal shampoo.  3. After you shampoo, rinse your hair and body thoroughly to remove the shampoo.       Wash your face and private area with the soap you use at home, then rinse.  4. Use CHG as you would any other liquid soap. You can apply CHG directly to the skin and wash gently with a scrungie or a clean washcloth.   5. Apply the CHG Soap to your body ONLY FROM THE NECK DOWN.   6. Do not use on open wounds or open sores. Avoid contact with your eyes, ears, mouth and genitals (private parts)  7. Wash thoroughly, paying special attention to the area where your surgery will be performed.  8. Thoroughly rinse your body with warm water from the neck down.  9. DO NOT shower/wash with your normal soap after using and rinsing off the CHG Soap.  10. Pat yourself dry with a CLEAN TOWEL.  11. Wear CLEAN PAJAMAS to bed the night before surgery, wear comfortable clothes the morning of surgery  12. Place CLEAN SHEETS on your bed the night of your first shower and DO NOT SLEEP WITH PETS.  Day of Surgery: Shower as Above  Please  wear clean clothes to the hospital/surgery center.               Remember to brush your teeth WITH YOUR REGULAR TOOTHPASTE.            Please read over the following fact sheets that you were given.

## 2018-04-17 NOTE — Pre-Procedure Instructions (Signed)
Gara KronerLouvenia C Vick  04/17/2018      [facility] - [place], [place] - [address] [place] [id number] Phone: [phone] Fax: [phone]   Your procedure is scheduled on  Tuesday, July 30.  [facility] Ad[facility] at 9:30 AM                     Your surgery or procedure is scheduled for 11:15 A.M.    Call this number if you have problems the morning of surgery: [phone]  This is the number for the Pre- Surgical Desk.   Remember:  Do not eat or drink after midnight Monday, July 29.  Take these medicines the morning of surgery with A SIP OF WATER :  atorvastatin (LIPITOR)  gabapentin (NEURONTIN) DULoxetine (CYMBALTA)  Follow your surgeon's instructions regarding holding or continuing Aspirin.  STOP / do not start taking Aspirin Products (Goody Powder, Excedrin Migraine), Ibuprofen (Advil), Naproxen (Aleve), nabumetone (RELAFEN) Vitamins and Herbal Products (ie Fish Oil).   Special Instructions:   [facility]- Preparing For Surgery  Before surgery, you can play an important role. Because skin is not sterile, your skin needs to be as free of germs as possible. You can reduce the number of germs on your skin by washing with CHG (chlorahexidine gluconate) Soap before surgery.  CHG is an antiseptic cleaner which kills germs and bonds with the skin to continue killing germs even after washing.    Oral Hygiene is also important to reduce your risk of infection.  Remember - BRUSH YOUR TEETH THE MORNING OF SURGERY WITH YOUR REGULAR TOOTHPASTE  Please do not use if you have an allergy to CHG or antibacterial soaps. If your skin becomes reddened/irritated stop using the CHG.  Do not shave (including legs and underarms) for at least 48 hours prior to first CHG shower. It is OK to shave your face.  Please follow these instructions carefully.   1. Shower the NIGHT BEFORE SURGERY and the MORNING OF SURGERY with CHG.    2. If you chose to wash your hair, wash your hair first as usual with your normal shampoo.  3. After you shampoo, rinse your hair and body thoroughly to remove the shampoo.       Wash your face and private area with the soap you use at home, then rinse.  4. Use CHG as you would any other liquid soap. You can apply CHG directly to the skin and wash gently with a scrungie or a clean washcloth.   5. Apply the CHG Soap to your body ONLY FROM THE NECK DOWN.   6. Do not use on open wounds or open sores. Avoid contact with your eyes, ears, mouth and genitals (private parts)  7. Wash thoroughly, paying special attention to the area where your surgery will be performed.  8. Thoroughly rinse your body with warm water from the neck down.  9. DO NOT shower/wash with your normal soap after using and rinsing off the CHG Soap.  10. Pat yourself dry with a CLEAN TOWEL.  11. Wear CLEAN PAJAMAS to bed the night before surgery, wear comfortable clothes the morning of surgery  12. Place CLEAN SHEETS on your bed the night of your first shower and DO NOT SLEEP WITH PETS.  Day of Surgery: Shower as Above  Please wear clean clothes to the hospital/surgery center.    Do not wear lotions, powders, or perfumes,  or deodorant. Remember to brush your teeth WITH YOUR REGULAR TOOTHPASTE.  Do not wear jewelry, make-up or nail polish.  Do not shave 48 hours prior to surgery.   Do not bring valuables to the hospital.  [facility] is not responsible for any belongings or valuables.  Contacts, dentures or bridgework may not be worn into surgery.  Leave your suitcase in the car.  After surgery it may be brought to your room.  For patients admitted to the hospital, discharge time will be determined by your treatment team.  Please read over the following fact sheets that you were given.

## 2018-04-17 NOTE — Progress Notes (Signed)
PCP - Dr. Selena BattenKim- [facility] [place]  Cardiologist - Denies  Chest x-ray - Denies  EKG - 04/17/18  Stress Test - Denies  ECHO - Denies  Cardiac Cath - Denies  Sleep Study - Denies CPAP - None  LABS- 04/17/18: CBC w/D, BMP, T/S, PCR  ASA- LD- 7/20   Anesthesia-  Yes- EKG  Pt denies having chest pain, sob, or fever at this time. All instructions explained to the pt, with a verbal understanding of the material. Pt agrees to go over the instructions while at home for a better understanding. The opportunity to ask questions was provided.

## 2018-04-21 ENCOUNTER — Inpatient Hospital Stay (HOSPITAL_COMMUNITY): Payer: BLUE CROSS/BLUE SHIELD | Admitting: Physician Assistant

## 2018-04-21 ENCOUNTER — Other Ambulatory Visit: Payer: Self-pay

## 2018-04-21 ENCOUNTER — Encounter (HOSPITAL_COMMUNITY): Payer: Self-pay | Admitting: *Deleted

## 2018-04-21 ENCOUNTER — Inpatient Hospital Stay (HOSPITAL_COMMUNITY)
Admission: RE | Disposition: A | Discharge status: Skilled Nursing Facility | Payer: Self-pay | Source: Home / Self Care | Attending: Neurosurgery

## 2018-04-21 ENCOUNTER — Inpatient Hospital Stay (HOSPITAL_COMMUNITY): Payer: BLUE CROSS/BLUE SHIELD

## 2018-04-21 ENCOUNTER — Inpatient Hospital Stay (HOSPITAL_COMMUNITY)
Admission: RE | Admit: 2018-04-21 | Discharge: 2018-04-28 | DRG: 454 | Disposition: A | Discharge status: Skilled Nursing Facility | Payer: BLUE CROSS/BLUE SHIELD | Attending: Neurosurgery | Admitting: Neurosurgery

## 2018-04-21 DIAGNOSIS — R509 Fever, unspecified: Secondary | ICD-10-CM

## 2018-04-21 DIAGNOSIS — Z7982 Long term (current) use of aspirin: Secondary | ICD-10-CM | POA: Diagnosis not present

## 2018-04-21 DIAGNOSIS — M48062 Spinal stenosis, lumbar region with neurogenic claudication: Secondary | ICD-10-CM | POA: Diagnosis present

## 2018-04-21 DIAGNOSIS — M4316 Spondylolisthesis, lumbar region: Secondary | ICD-10-CM | POA: Diagnosis present

## 2018-04-21 DIAGNOSIS — D62 Acute posthemorrhagic anemia: Secondary | ICD-10-CM | POA: Diagnosis not present

## 2018-04-21 DIAGNOSIS — I10 Essential (primary) hypertension: Secondary | ICD-10-CM | POA: Diagnosis present

## 2018-04-21 DIAGNOSIS — I959 Hypotension, unspecified: Secondary | ICD-10-CM | POA: Diagnosis not present

## 2018-04-21 DIAGNOSIS — M4306 Spondylolysis, lumbar region: Secondary | ICD-10-CM

## 2018-04-21 DIAGNOSIS — M4310 Spondylolisthesis, site unspecified: Secondary | ICD-10-CM | POA: Diagnosis present

## 2018-04-21 SURGERY — POSTERIOR LUMBAR FUSION 2 LEVEL
Anesthesia: General | Site: Back

## 2018-04-21 MED ORDER — GARLIC 1000 MG PO CAPS: 1000.0000 mg | ORAL_CAPSULE | Freq: Every day | ORAL | Status: DC

## 2018-04-21 MED ORDER — ALBUMIN HUMAN 5 % IV SOLN
INTRAVENOUS | Status: DC | PRN
  Administered 2018-04-21: 13:00:00 via INTRAVENOUS

## 2018-04-21 MED ORDER — EPHEDRINE SULFATE 50 MG/ML IJ SOLN
INTRAMUSCULAR | Status: DC | PRN
  Administered 2018-04-21: 5 mg via INTRAVENOUS
  Administered 2018-04-21 (×4): 10 mg via INTRAVENOUS
  Administered 2018-04-21: 5 mg via INTRAVENOUS

## 2018-04-21 MED ORDER — MIDAZOLAM HCL 2 MG/2ML IJ SOLN
INTRAMUSCULAR | Status: AC
  Filled 2018-04-21: qty 2

## 2018-04-21 MED ORDER — ROCURONIUM BROMIDE 10 MG/ML (PF) SYRINGE
PREFILLED_SYRINGE | INTRAVENOUS | Status: AC
  Filled 2018-04-21: qty 10

## 2018-04-21 MED ORDER — MENTHOL 3 MG MT LOZG
1.0000 | LOZENGE | OROMUCOSAL | Status: DC | PRN
  Filled 2018-04-21: qty 9

## 2018-04-21 MED ORDER — OXYCODONE HCL 5 MG PO TABS
ORAL_TABLET | ORAL | Status: AC
  Filled 2018-04-21: qty 1

## 2018-04-21 MED ORDER — HYDROCODONE-ACETAMINOPHEN 10-325 MG PO TABS
1.0000 | ORAL_TABLET | ORAL | Status: DC | PRN
  Administered 2018-04-21 – 2018-04-28 (×12): 1 via ORAL
  Filled 2018-04-21 (×12): qty 1

## 2018-04-21 MED ORDER — SODIUM CHLORIDE 0.9% FLUSH: 3.0000 mL | INTRAVENOUS | Status: DC | PRN

## 2018-04-21 MED ORDER — ACETAMINOPHEN 650 MG RE SUPP: 650.0000 mg | RECTAL | Status: DC | PRN

## 2018-04-21 MED ORDER — CHLORHEXIDINE GLUCONATE CLOTH 2 % EX PADS: 6.0000 | MEDICATED_PAD | Freq: Once | CUTANEOUS | Status: DC

## 2018-04-21 MED ORDER — LACTATED RINGERS IV SOLN: INTRAVENOUS | Status: DC

## 2018-04-21 MED ORDER — ONDANSETRON HCL 4 MG/2ML IJ SOLN
INTRAMUSCULAR | Status: DC | PRN
  Administered 2018-04-21: 4 mg via INTRAVENOUS

## 2018-04-21 MED ORDER — THROMBIN 20000 UNITS EX SOLR
CUTANEOUS | Status: DC | PRN
  Administered 2018-04-21: 20 mL via TOPICAL

## 2018-04-21 MED ORDER — DIAZEPAM 5 MG PO TABS
5.0000 mg | ORAL_TABLET | Freq: Four times a day (QID) | ORAL | Status: DC | PRN
  Administered 2018-04-22 – 2018-04-27 (×6): 5 mg via ORAL
  Filled 2018-04-21 (×6): qty 1

## 2018-04-21 MED ORDER — PROPOFOL 10 MG/ML IV BOLUS
INTRAVENOUS | Status: DC | PRN
  Administered 2018-04-21: 120 mg via INTRAVENOUS

## 2018-04-21 MED ORDER — SODIUM CHLORIDE 0.9 % IV SOLN
INTRAVENOUS | Status: DC | PRN
  Administered 2018-04-21: 500 mL

## 2018-04-21 MED ORDER — SUGAMMADEX SODIUM 200 MG/2ML IV SOLN
INTRAVENOUS | Status: DC | PRN
  Administered 2018-04-21: 175 mg via INTRAVENOUS

## 2018-04-21 MED ORDER — FLEET ENEMA 7-19 GM/118ML RE ENEM: 1.0000 | ENEMA | Freq: Once | RECTAL | Status: DC | PRN

## 2018-04-21 MED ORDER — VITAMIN B-12 1000 MCG PO TABS
1000.0000 ug | ORAL_TABLET | Freq: Every day | ORAL | Status: DC
  Administered 2018-04-22 – 2018-04-28 (×7): 1000 ug via ORAL
  Filled 2018-04-21 (×7): qty 1

## 2018-04-21 MED ORDER — SODIUM CHLORIDE 0.9% FLUSH
3.0000 mL | Freq: Two times a day (BID) | INTRAVENOUS | Status: DC
  Administered 2018-04-22 (×2): 3 mL via INTRAVENOUS

## 2018-04-21 MED ORDER — BIOTIN 5000 MCG PO TABS: 5000.0000 ug | ORAL_TABLET | Freq: Every day | ORAL | Status: DC

## 2018-04-21 MED ORDER — HYDROMORPHONE HCL 1 MG/ML IJ SOLN
INTRAMUSCULAR | Status: AC
  Filled 2018-04-21: qty 1

## 2018-04-21 MED ORDER — GLYCOPYRROLATE PF 0.2 MG/ML IJ SOSY
PREFILLED_SYRINGE | INTRAMUSCULAR | Status: AC
  Filled 2018-04-21: qty 1

## 2018-04-21 MED ORDER — VANCOMYCIN HCL 1 G IV SOLR
INTRAVENOUS | Status: DC | PRN
  Administered 2018-04-21: 1000 mg

## 2018-04-21 MED ORDER — PHENOL 1.4 % MT LIQD: 1.0000 | OROMUCOSAL | Status: DC | PRN

## 2018-04-21 MED ORDER — DIAZEPAM 5 MG PO TABS
ORAL_TABLET | ORAL | Status: AC
  Administered 2018-04-21: 5 mg
  Filled 2018-04-21: qty 1

## 2018-04-21 MED ORDER — DULOXETINE HCL 30 MG PO CPEP
30.0000 mg | ORAL_CAPSULE | Freq: Two times a day (BID) | ORAL | Status: DC
  Administered 2018-04-21 – 2018-04-28 (×14): 30 mg via ORAL
  Filled 2018-04-21 (×14): qty 1

## 2018-04-21 MED ORDER — PHENYLEPHRINE HCL 10 MG/ML IJ SOLN
INTRAMUSCULAR | Status: DC | PRN
  Administered 2018-04-21: 80 ug via INTRAVENOUS
  Administered 2018-04-21: 40 ug via INTRAVENOUS
  Administered 2018-04-21 (×2): 80 ug via INTRAVENOUS

## 2018-04-21 MED ORDER — ATORVASTATIN CALCIUM 10 MG PO TABS
10.0000 mg | ORAL_TABLET | Freq: Every day | ORAL | Status: DC
  Administered 2018-04-21 – 2018-04-28 (×8): 10 mg via ORAL
  Filled 2018-04-21 (×8): qty 1

## 2018-04-21 MED ORDER — FERROUS SULFATE 325 (65 FE) MG PO TABS
325.0000 mg | ORAL_TABLET | Freq: Every day | ORAL | Status: DC
  Administered 2018-04-22 – 2018-04-28 (×7): 325 mg via ORAL
  Filled 2018-04-21 (×7): qty 1

## 2018-04-21 MED ORDER — VITAMIN C 500 MG PO TABS
500.0000 mg | ORAL_TABLET | Freq: Every day | ORAL | Status: DC
  Administered 2018-04-22 – 2018-04-28 (×7): 500 mg via ORAL
  Filled 2018-04-21 (×7): qty 1

## 2018-04-21 MED ORDER — FENTANYL CITRATE (PF) 250 MCG/5ML IJ SOLN
INTRAMUSCULAR | Status: AC
  Filled 2018-04-21: qty 5

## 2018-04-21 MED ORDER — HYDROMORPHONE HCL 1 MG/ML IJ SOLN
0.2500 mg | INTRAMUSCULAR | Status: DC | PRN
  Administered 2018-04-21 (×2): 0.5 mg via INTRAVENOUS

## 2018-04-21 MED ORDER — BISACODYL 10 MG RE SUPP: 10.0000 mg | Freq: Every day | RECTAL | Status: DC | PRN

## 2018-04-21 MED ORDER — PROMETHAZINE HCL 25 MG/ML IJ SOLN: 6.2500 mg | INTRAMUSCULAR | Status: DC | PRN

## 2018-04-21 MED ORDER — THROMBIN (RECOMBINANT) 20000 UNITS EX SOLR
CUTANEOUS | Status: AC
  Filled 2018-04-21: qty 20000

## 2018-04-21 MED ORDER — HYDROMORPHONE HCL 1 MG/ML IJ SOLN
1.0000 mg | INTRAMUSCULAR | Status: DC | PRN
  Administered 2018-04-22 – 2018-04-28 (×8): 1 mg via INTRAVENOUS
  Filled 2018-04-21 (×9): qty 1

## 2018-04-21 MED ORDER — FENTANYL CITRATE (PF) 100 MCG/2ML IJ SOLN
INTRAMUSCULAR | Status: DC | PRN
  Administered 2018-04-21 (×5): 50 ug via INTRAVENOUS

## 2018-04-21 MED ORDER — ROCURONIUM BROMIDE 100 MG/10ML IV SOLN
INTRAVENOUS | Status: DC | PRN
  Administered 2018-04-21: 30 mg via INTRAVENOUS
  Administered 2018-04-21: 10 mg via INTRAVENOUS
  Administered 2018-04-21: 50 mg via INTRAVENOUS

## 2018-04-21 MED ORDER — BUPIVACAINE HCL (PF) 0.25 % IJ SOLN
INTRAMUSCULAR | Status: AC
  Filled 2018-04-21: qty 30

## 2018-04-21 MED ORDER — MIDAZOLAM HCL 5 MG/5ML IJ SOLN
INTRAMUSCULAR | Status: DC | PRN
  Administered 2018-04-21: 2 mg via INTRAVENOUS

## 2018-04-21 MED ORDER — CEFAZOLIN SODIUM-DEXTROSE 2-4 GM/100ML-% IV SOLN
2.0000 g | INTRAVENOUS | Status: AC
  Administered 2018-04-21: 2 g via INTRAVENOUS
  Filled 2018-04-21: qty 100

## 2018-04-21 MED ORDER — ACETAMINOPHEN 325 MG PO TABS
650.0000 mg | ORAL_TABLET | ORAL | Status: DC | PRN
  Administered 2018-04-22 – 2018-04-23 (×4): 650 mg via ORAL
  Filled 2018-04-21 (×4): qty 2

## 2018-04-21 MED ORDER — VITAMIN E 180 MG (400 UNIT) PO CAPS
400.0000 [IU] | ORAL_CAPSULE | Freq: Every day | ORAL | Status: DC
  Administered 2018-04-22 – 2018-04-28 (×7): 400 [IU] via ORAL
  Filled 2018-04-21 (×7): qty 1

## 2018-04-21 MED ORDER — OXYCODONE HCL 5 MG PO TABS
10.0000 mg | ORAL_TABLET | ORAL | Status: DC | PRN
  Administered 2018-04-21 – 2018-04-24 (×8): 10 mg via ORAL
  Filled 2018-04-21 (×8): qty 2

## 2018-04-21 MED ORDER — ONDANSETRON HCL 4 MG PO TABS
4.0000 mg | ORAL_TABLET | Freq: Four times a day (QID) | ORAL | Status: DC | PRN
Start: 2018-04-21 — End: 2018-04-28
  Administered 2018-04-23: 4 mg via ORAL
  Filled 2018-04-21: qty 1

## 2018-04-21 MED ORDER — LIDOCAINE HCL (CARDIAC) PF 100 MG/5ML IV SOSY
PREFILLED_SYRINGE | INTRAVENOUS | Status: DC | PRN
  Administered 2018-04-21: 100 mg via INTRAVENOUS

## 2018-04-21 MED ORDER — POLYETHYLENE GLYCOL 3350 17 G PO PACK
17.0000 g | PACK | Freq: Every day | ORAL | Status: DC | PRN
  Administered 2018-04-25: 17 g via ORAL
  Filled 2018-04-21: qty 1

## 2018-04-21 MED ORDER — CEFAZOLIN SODIUM-DEXTROSE 1-4 GM/50ML-% IV SOLN
1.0000 g | Freq: Three times a day (TID) | INTRAVENOUS | Status: AC
  Administered 2018-04-21 – 2018-04-22 (×2): 1 g via INTRAVENOUS
  Filled 2018-04-21 (×2): qty 50

## 2018-04-21 MED ORDER — 0.9 % SODIUM CHLORIDE (POUR BTL) OPTIME
TOPICAL | Status: DC | PRN
  Administered 2018-04-21: 1000 mL

## 2018-04-21 MED ORDER — BUPIVACAINE HCL (PF) 0.25 % IJ SOLN
INTRAMUSCULAR | Status: DC | PRN
  Administered 2018-04-21: 10 mL

## 2018-04-21 MED ORDER — CHOLECALCIFEROL 10 MCG (400 UNIT) PO TABS
400.0000 [IU] | ORAL_TABLET | Freq: Every day | ORAL | Status: DC
  Administered 2018-04-22 – 2018-04-28 (×7): 400 [IU] via ORAL
  Filled 2018-04-21 (×7): qty 1

## 2018-04-21 MED ORDER — DEXAMETHASONE SODIUM PHOSPHATE 10 MG/ML IJ SOLN
10.0000 mg | INTRAMUSCULAR | Status: AC
  Administered 2018-04-21: 10 mg via INTRAVENOUS
  Filled 2018-04-21: qty 1

## 2018-04-21 MED ORDER — FUROSEMIDE 40 MG PO TABS
40.0000 mg | ORAL_TABLET | Freq: Every day | ORAL | Status: DC
  Administered 2018-04-23 – 2018-04-28 (×6): 40 mg via ORAL
  Filled 2018-04-21 (×6): qty 1

## 2018-04-21 MED ORDER — LISINOPRIL 20 MG PO TABS
40.0000 mg | ORAL_TABLET | Freq: Every day | ORAL | Status: DC
  Administered 2018-04-26 – 2018-04-28 (×3): 40 mg via ORAL
  Filled 2018-04-21 (×5): qty 2

## 2018-04-21 MED ORDER — SODIUM CHLORIDE 0.9 % IV SOLN
INTRAVENOUS | Status: DC | PRN
  Administered 2018-04-21: 10 ug/min via INTRAVENOUS

## 2018-04-21 MED ORDER — ONDANSETRON HCL 4 MG/2ML IJ SOLN
4.0000 mg | Freq: Four times a day (QID) | INTRAMUSCULAR | Status: DC | PRN
  Administered 2018-04-25 – 2018-04-28 (×6): 4 mg via INTRAVENOUS
  Filled 2018-04-21 (×6): qty 2

## 2018-04-21 MED ORDER — LIDOCAINE 2% (20 MG/ML) 5 ML SYRINGE
INTRAMUSCULAR | Status: AC
  Filled 2018-04-21: qty 5

## 2018-04-21 MED ORDER — ONDANSETRON HCL 4 MG/2ML IJ SOLN
INTRAMUSCULAR | Status: AC
  Filled 2018-04-21: qty 2

## 2018-04-21 MED ORDER — SODIUM CHLORIDE 0.9 % IV SOLN: 250.0000 mL | INTRAVENOUS | Status: DC

## 2018-04-21 MED ORDER — MEGARED OMEGA-3 KRILL OIL 500 MG PO CAPS: 1.0000 | ORAL_CAPSULE | Freq: Every day | ORAL | Status: DC

## 2018-04-21 MED ORDER — LACTATED RINGERS IV SOLN
INTRAVENOUS | Status: DC
  Administered 2018-04-21 (×2): via INTRAVENOUS

## 2018-04-21 MED ORDER — PROPOFOL 10 MG/ML IV BOLUS
INTRAVENOUS | Status: AC
  Filled 2018-04-21: qty 20

## 2018-04-21 MED ORDER — VANCOMYCIN HCL 1000 MG IV SOLR
INTRAVENOUS | Status: AC
  Filled 2018-04-21: qty 1000

## 2018-04-21 MED ORDER — TRAMADOL HCL 50 MG PO TABS
50.0000 mg | ORAL_TABLET | Freq: Three times a day (TID) | ORAL | Status: DC | PRN
  Administered 2018-04-22 – 2018-04-23 (×2): 50 mg via ORAL
  Filled 2018-04-21 (×3): qty 1

## 2018-04-21 MED ORDER — GABAPENTIN 600 MG PO TABS
600.0000 mg | ORAL_TABLET | Freq: Three times a day (TID) | ORAL | Status: DC
  Administered 2018-04-21 – 2018-04-28 (×21): 600 mg via ORAL
  Filled 2018-04-21 (×21): qty 1

## 2018-04-21 MED ORDER — THROMBIN 5000 UNITS EX SOLR
CUTANEOUS | Status: AC
  Filled 2018-04-21: qty 5000

## 2018-04-21 MED ORDER — MEPERIDINE HCL 50 MG/ML IJ SOLN: 6.2500 mg | INTRAMUSCULAR | Status: DC | PRN

## 2018-04-21 MED ORDER — SUGAMMADEX SODIUM 200 MG/2ML IV SOLN
INTRAVENOUS | Status: AC
  Filled 2018-04-21: qty 2

## 2018-04-21 SURGICAL SUPPLY — 68 items
ADH SKN CLS APL DERMABOND .7 (GAUZE/BANDAGES/DRESSINGS) ×1
APL SKNCLS STERI-STRIP NONHPOA (GAUZE/BANDAGES/DRESSINGS) ×1
BAG DECANTER FOR FLEXI CONT (MISCELLANEOUS) ×2 IMPLANT
BENZOIN TINCTURE PRP APPL 2/3 (GAUZE/BANDAGES/DRESSINGS) ×2 IMPLANT
BLADE CLIPPER SURG (BLADE) IMPLANT
BUR CUTTER 7.0 ROUND (BURR) IMPLANT
BUR MATCHSTICK NEURO 3.0 LAGG (BURR) ×2 IMPLANT
CANISTER SUCT 3000ML PPV (MISCELLANEOUS) ×2 IMPLANT
CAP LCK SPNE (Orthopedic Implant) ×6 IMPLANT
CAP LOCK SPINE RADIUS (Orthopedic Implant) IMPLANT
CAP LOCKING (Orthopedic Implant) ×12 IMPLANT
CARTRIDGE OIL MAESTRO DRILL (MISCELLANEOUS) ×1 IMPLANT
CONT SPEC 4OZ CLIKSEAL STRL BL (MISCELLANEOUS) ×2 IMPLANT
COVER BACK TABLE 60X90IN (DRAPES) ×2 IMPLANT
DECANTER SPIKE VIAL GLASS SM (MISCELLANEOUS) ×2 IMPLANT
DERMABOND ADVANCED (GAUZE/BANDAGES/DRESSINGS) ×1
DERMABOND ADVANCED .7 DNX12 (GAUZE/BANDAGES/DRESSINGS) ×1 IMPLANT
DEVICE INTERBODY ELEVATE 10X23 (Cage) ×2 IMPLANT
DEVICE INTERBODY ELEVATE 23X8 (Cage) ×4 IMPLANT
DIFFUSER DRILL AIR PNEUMATIC (MISCELLANEOUS) ×2 IMPLANT
DRAPE C-ARM 42X72 X-RAY (DRAPES) ×4 IMPLANT
DRAPE HALF SHEET 40X57 (DRAPES) ×1 IMPLANT
DRAPE LAPAROTOMY 100X72X124 (DRAPES) ×2 IMPLANT
DRAPE SURG 17X23 STRL (DRAPES) ×8 IMPLANT
DRSG OPSITE POSTOP 4X6 (GAUZE/BANDAGES/DRESSINGS) ×1 IMPLANT
DRSG OPSITE POSTOP 4X8 (GAUZE/BANDAGES/DRESSINGS) ×1 IMPLANT
DURAPREP 26ML APPLICATOR (WOUND CARE) ×2 IMPLANT
ELECT REM PT RETURN 9FT ADLT (ELECTROSURGICAL) ×2
ELECTRODE REM PT RTRN 9FT ADLT (ELECTROSURGICAL) ×1 IMPLANT
EVACUATOR 1/8 PVC DRAIN (DRAIN) IMPLANT
GAUZE SPONGE 4X4 12PLY STRL (GAUZE/BANDAGES/DRESSINGS) IMPLANT
GAUZE SPONGE 4X4 16PLY XRAY LF (GAUZE/BANDAGES/DRESSINGS) ×1 IMPLANT
GLOVE BIO SURGEON STRL SZ 6.5 (GLOVE) ×1 IMPLANT
GLOVE BIOGEL PI IND STRL 6.5 (GLOVE) IMPLANT
GLOVE BIOGEL PI INDICATOR 6.5 (GLOVE) ×5
GLOVE ECLIPSE 9.0 STRL (GLOVE) ×4 IMPLANT
GLOVE EXAM NITRILE LRG STRL (GLOVE) IMPLANT
GLOVE EXAM NITRILE XL STR (GLOVE) IMPLANT
GLOVE EXAM NITRILE XS STR PU (GLOVE) IMPLANT
GLOVE SURG SS PI 6.0 STRL IVOR (GLOVE) ×2 IMPLANT
GLOVE SURG SS PI 6.5 STRL IVOR (GLOVE) ×5 IMPLANT
GOWN STRL REUS W/ TWL LRG LVL3 (GOWN DISPOSABLE) IMPLANT
GOWN STRL REUS W/ TWL XL LVL3 (GOWN DISPOSABLE) ×2 IMPLANT
GOWN STRL REUS W/TWL 2XL LVL3 (GOWN DISPOSABLE) IMPLANT
GOWN STRL REUS W/TWL LRG LVL3 (GOWN DISPOSABLE) ×10
GOWN STRL REUS W/TWL XL LVL3 (GOWN DISPOSABLE) ×4
KIT BASIN OR (CUSTOM PROCEDURE TRAY) ×2 IMPLANT
KIT TURNOVER KIT B (KITS) ×2 IMPLANT
MILL MEDIUM DISP (BLADE) ×2 IMPLANT
NEEDLE HYPO 22GX1.5 SAFETY (NEEDLE) ×2 IMPLANT
NS IRRIG 1000ML POUR BTL (IV SOLUTION) ×2 IMPLANT
OIL CARTRIDGE MAESTRO DRILL (MISCELLANEOUS) ×2
PACK LAMINECTOMY NEURO (CUSTOM PROCEDURE TRAY) ×2 IMPLANT
ROD 5.5X60MM GREEN (Rod) ×2 IMPLANT
SCREW 5.75X40M (Screw) ×2 IMPLANT
SCREW 5.75X45MM (Screw) ×4 IMPLANT
SPACER SPNL STD 23X8XSTRL (Cage) IMPLANT
SPCR SPNL STD 23X8XSTRL (Cage) ×2 IMPLANT
SPONGE SURGIFOAM ABS GEL 100 (HEMOSTASIS) ×2 IMPLANT
STRIP CLOSURE SKIN 1/2X4 (GAUZE/BANDAGES/DRESSINGS) ×3 IMPLANT
SUT VIC AB 0 CT1 18XCR BRD8 (SUTURE) ×2 IMPLANT
SUT VIC AB 0 CT1 8-18 (SUTURE) ×2
SUT VIC AB 2-0 CT1 18 (SUTURE) ×2 IMPLANT
SUT VIC AB 3-0 SH 8-18 (SUTURE) ×3 IMPLANT
TOWEL GREEN STERILE (TOWEL DISPOSABLE) ×2 IMPLANT
TOWEL GREEN STERILE FF (TOWEL DISPOSABLE) ×2 IMPLANT
TRAY FOLEY MTR SLVR 16FR STAT (SET/KITS/TRAYS/PACK) ×2 IMPLANT
WATER STERILE IRR 1000ML POUR (IV SOLUTION) ×2 IMPLANT

## 2018-04-21 NOTE — Anesthesia Preprocedure Evaluation (Addendum)
Anesthesia Evaluation  Patient identified by MRN, date of birth, ID band Patient awake    Reviewed: Allergy & Precautions, NPO status , Patient's Chart, lab work & pertinent test results  Airway Mallampati: I  TM Distance: >3 FB Neck ROM: Full    Dental  (+) Upper Dentures, Lower Dentures   Pulmonary asthma ,    breath sounds clear to auscultation       Cardiovascular hypertension, Pt. on medications  Rhythm:Regular Rate:Normal     Neuro/Psych negative neurological ROS  negative psych ROS   GI/Hepatic negative GI ROS, Neg liver ROS,   Endo/Other  negative endocrine ROS  Renal/GU negative Renal ROS     Musculoskeletal negative musculoskeletal ROS (+)   Abdominal Normal abdominal exam  (+)   Peds  Hematology negative hematology ROS (+)   Anesthesia Other Findings   Reproductive/Obstetrics                            Lab Results  Component Value Date   WBC 3.3 (L) 04/17/2018   HGB 11.7 (L) 04/17/2018   HCT 38.1 04/17/2018   MCV 95.3 04/17/2018   PLT 237 04/17/2018   Lab Results  Component Value Date   CREATININE 1.24 (H) 04/17/2018   BUN 25 (H) 04/17/2018   NA 140 04/17/2018   K 4.2 04/17/2018   CL 102 04/17/2018   CO2 29 04/17/2018   No results found for: INR, PROTIME  EKG: sinus bradycardia.  Anesthesia Physical Anesthesia Plan  ASA: II  Anesthesia Plan: General   Post-op Pain Management:    Induction: Intravenous  PONV Risk Score and Plan: 4 or greater and Ondansetron, Dexamethasone and Midazolam  Airway Management Planned: Oral ETT  Additional Equipment: None  Intra-op Plan:   Post-operative Plan: Extubation in OR  Informed Consent: I have reviewed the patients History and Physical, chart, labs and discussed the procedure including the risks, benefits and alternatives for the proposed anesthesia with the patient or authorized representative who has indicated  his/her understanding and acceptance.   Dental advisory given  Plan Discussed with: CRNA  Anesthesia Plan Comments:        Anesthesia Quick Evaluation

## 2018-04-21 NOTE — Brief Op Note (Signed)
04/21/2018  4:30 PM  PATIENT:  Sarah Woodward  62 y.o. female  PRE-OPERATIVE DIAGNOSIS:  Spondylolisthesis  POST-OPERATIVE DIAGNOSIS:  Spondylolisthesis  PROCEDURE:  Procedure(s): POSTERIOR LUMBAR INTERBODY FUSION - LUMBAR THREE-LUMBAR FOUR - LUMBAR FOUR-LUMBAR FIVE (N/A)  SURGEON:  Surgeon(s) and Role:    * Julio SicksPool, Jenina Moening, MD - Primary  PHYSICIAN ASSISTANT:   ASSISTANTS: Cabbell  ANESTHESIA:   general  EBL:  400 mL   BLOOD ADMINISTERED:none  DRAINS: none   LOCAL MEDICATIONS USED:  MARCAINE     SPECIMEN:  No Specimen  DISPOSITION OF SPECIMEN:  N/A  COUNTS:  YES  TOURNIQUET:  * No tourniquets in log *  DICTATION: .Dragon Dictation  PLAN OF CARE: Admit to inpatient   PATIENT DISPOSITION:  PACU - hemodynamically stable.   Delay start of Pharmacological VTE agent (>24hrs) due to surgical blood loss or risk of bleeding: yes

## 2018-04-21 NOTE — CV Procedure (Signed)
Date of procedure: 04/21/2018  Date of dictation: Same  Service: Neurosurgery  Preoperative diagnosis: L3-4 grade 1 degenerative spondylolisthesis with stenosis and neurogenic claudication, L4-5 grade 2 degenerative spondylolisthesis with critical stenosis and neurogenic claudication  Postoperative diagnosis: Same  Procedure Name: Bilateral L3-4 and L4-5 decompressive laminotomies and foraminotomies, more than would be required for simple interbody fusion alone.  L3-4, L 45 posterior lumbar interbody fusion utilizing interbody cages and locally harvested autograft  L3-4-5 posterior lateral arthrodesis utilizing segmental pedicle screw fixation and local autograft  Surgeon:Jamiyah Dingley A.Ovie Eastep, M.D.  Asst. Surgeon: Franky Machoabbell  Anesthesia: General  Indication: 62 year old female with severe back and bilateral lower extremity pain paresthesias and weakness failing conservative management.  Work-up demonstrates evidence of grade 1 L3-4 degenerative spondylolisthesis with significant stenosis.  At L4-5 the patient has a severe grade 2 L4-5 degenerative spinal this with kyphotic angulation and critical spinal stenosis.  Patient presents now for decompression and fusion surgery in hopes of improving her symptoms.  Operative note: After induction anesthesia, patient position prone on the Wilson frame and properly padded.  Lumbar region prepped and draped sterilely.  Incision made from L3-L5.  Dissection performed bilaterally.  Retractor placed.  Fluoroscopy used.  Levels confirmed.  Decompressive laminotomies then performed using [facility] a high-speed drill to remove the inferior two thirds of the lamina of L3 and L4 bilaterally the entire inferior facet of L3 and L4 bilaterally the majority the superior facet of L4 and L5 bilaterally.  Ligament flavum was elevated and resected.  Foraminotomies were completed on the course exiting L3-L4 and L5 nerve roots bilaterally.  Bilateral discectomies  then performed at L3-4.  The space was then distracted.  Disc space deformity at L4-5 was gently and sequentially reduced.  With retractors placed the patient's right side disc spaces were then prepared for interbody fusion.  Soft tissue was cleaned from the interspace.  At L4 5 to 10 mm Medtronic expandable cage packed with locally harvested autograft was impacted into place and expanded to its full extent.  At L3-4 and a 8 mm implant was impacted in place and expanded to its full extent.  Distractor removed patient's right side.  The space cleaned of all soft tissue.  Morselized autograft packed in the interspace.  Cages then impacted in the place and expanded to their full extent.  Pedicles of L3-L4 and L5 were then identified using surface landmarks and intraoperative fluoroscopy.  Superficial bone running the pedicle was then removed using high-speed drill.  Each pedicle was then probed using pedicle all each pedicle tract was then probed and found to be solidly within the bone.  Each pedicle tract was then tapped and then 5.75 mm radius bran screws from [employer] were placed bilaterally at L3-L4 and L5.  Short segment titanium rods in place to the screw heads.  Locking caps placed through the screws were locking caps and engaged with a construct under compression.  Final images reveal good position of the cages and the hardware at the proper operative level with marked improvement of patient's alignment.  Wound was then irrigated one final time.  Gelfoam was placed topically for hemostasis.  Transverse processes were decorticated.  Morselized autograft packed posterior laterally.  Vancomycin powder placed the deep wound space.  Wounds and closed in layers with Vicryl sutures.  Steri-Strips sterile dressing were applied.  No apparent complications.  Patient tolerated the procedure well and she returns to the recovery room postop.

## 2018-04-21 NOTE — Anesthesia Postprocedure Evaluation (Signed)
Anesthesia Post Note  Patient: Sarah Woodward  Procedure(s) Performed: POSTERIOR LUMBAR INTERBODY FUSION - LUMBAR THREE-LUMBAR FOUR - LUMBAR FOUR-LUMBAR FIVE (N/A Back)     Patient location during evaluation: PACU Anesthesia Type: General Level of consciousness: awake and alert Pain management: pain level controlled Vital Signs Assessment: post-procedure vital signs reviewed and stable Respiratory status: spontaneous breathing, nonlabored ventilation, respiratory function stable and patient connected to nasal cannula oxygen Cardiovascular status: blood pressure returned to baseline and stable Postop Assessment: no apparent nausea or vomiting Anesthetic complications: no    Last Vitals:  Vitals:   04/21/18 1804 04/21/18 1941  BP: 104/82 109/72  Pulse: 73 66  Resp: 17 18  Temp:  36.8 C  SpO2: 97% 94%                   Shelton SilvasKevin D Jakaiden Fill

## 2018-04-21 NOTE — H&P (Signed)
Sarah Woodward is an 62 y.o. female.   Chief Complaint: Back pain HPI: 62 year old female with severe back and bilateral lower extremity weakness consistent with severe neurogenic claudication.  Patient with ongoing sensory loss and weakness in both distal lower extremities.  Intermittent bowel and bladder dysfunction.  Work-up demonstrates evidence of critical spinal stenosis at L4-5 and severe spinal stenosis at L3-4 complicated byDegenerative spondylolisthesis at these levels.  Patient presents now for decompression and fusion surgery  Past Medical History:  Diagnosis Date  . Asthma   . Hypertension   . Spondylolisthesis of lumbar region     Past Surgical History:  Procedure Laterality Date  . COLONOSCOPY N/A 01/16/2017   Procedure: COLONOSCOPY;  Surgeon: Corbin Ade, MD;  Location: [facility];  Service: Endoscopy;  Laterality: N/A;  1200  . left thumb surgery      Family History  Problem Relation Age of Onset  . Colon cancer Neg Hx    Social History:  reports that she has never smoked. She has never used smokeless tobacco. She reports that she does not drink alcohol or use drugs.  Allergies:  Allergies  Allergen Reactions  . Codeine Nausea Only    Medications Prior to Admission  Medication Sig Dispense Refill  . acetaminophen (TYLENOL) 500 MG tablet Take 1,000 mg by mouth daily as needed for mild pain.    Marland Kitchen aspirin 325 MG tablet Take 325 mg by mouth daily.    Marland Kitchen atorvastatin (LIPITOR) 10 MG tablet Take 10 mg by mouth daily.    . Biotin 5000 MCG TABS Take 5,000 mcg by mouth daily.    . Cholecalciferol (VITAMIN D3) 400 units CAPS Take 1 capsule by mouth daily.    . DULoxetine (CYMBALTA) 30 MG capsule Take 30 mg by mouth 2 (two) times daily.   10  . ferrous sulfate (IRON SUPPLEMENT) 325 (65 FE) MG tablet Take 325 mg by mouth daily with breakfast.    . furosemide (LASIX) 40 MG tablet Take 40 mg by mouth daily.    Marland Kitchen gabapentin (NEURONTIN) 600 MG tablet Take 600 mg by  mouth 3 (three) times daily.   2  . Garlic 1000 MG CAPS Take 1,000 mg by mouth daily.    Marland Kitchen lisinopril (PRINIVIL,ZESTRIL) 40 MG tablet Take 40 mg by mouth daily.    Marland Kitchen MEGARED OMEGA-3 KRILL OIL 500 MG CAPS Take 1 capsule by mouth daily.    . nabumetone (RELAFEN) 750 MG tablet Take 750 mg by mouth 2 (two) times daily as needed for pain.    . traMADol (ULTRAM) 50 MG tablet Take 50 mg by mouth 3 (three) times daily as needed for moderate pain.   1  . VITAMIN A PO Take 1 tablet by mouth daily. 2400 MG    . vitamin B-12 (CYANOCOBALAMIN) 1000 MCG tablet Take 1,000 mcg by mouth daily.    . vitamin C (ASCORBIC ACID) 500 MG tablet Take 500 mg by mouth daily.    . vitamin E 400 UNIT capsule Take 400 Units by mouth daily.      No results found for this or any previous visit (from the past 48 hour(s)). No results found.  Pertinent items noted in HPI and remainder of comprehensive ROS otherwise negative.  Blood pressure (!) 149/90, pulse 71, temperature 98.7 F (37.1 C), temperature source Oral, resp. rate 20, height 5\' 7"  (1.702 m), weight 81.9 kg (180 lb 10 oz), SpO2 100 %.  Patient is awake and alert.  She  is oriented and appropriate.  Speech is fluent.  Judgment and insight are intact.  Cranial nerve function bilaterally normal.  Motor examination with significant weakness of dorsiflexion bilaterally grading out to over 5.  Sensory examination with decreased sensation pinprick and light touch in both L5 and S1 dermatomes bilaterally.  Straight leg raising positive bilaterally.  Reflexes absent in both lower extremities.  No evidence of long track signs.  Gait antalgic.  Posture flexed.  Examination of the head ears eyes nose throat is unremarkable her chest and abdomen are benign.  Extremities are free from injury deformity. Assessment/Plan L3-4, L4-5 severe spinal stenosis with neurogenic claudication complicated by degenerative spondylolisthesis.  Plan bilateral L3-4 and bilateral L4-5 decompressive  laminotomies and foraminotomies followed by posterior lumbar interbody fusion utilizing interbody cages, locally harvested autograft, and augmented with posterior lateral arthrodesis utilizing segmental pedicle screw fixation.  Risks and benefits of been explained.  Patient wishes to proceed.  Sherilyn CooterHenry A Naira Standiford 04/21/2018, 11:46 AM

## 2018-04-21 NOTE — Op Note (Addendum)
Date of procedure: 04/21/2018  Date of dictation: Same  Service: Neurosurgery  Preoperative diagnosis: L3-4 grade 1 degenerative spondylolisthesis with stenosis and neurogenic claudication, L4-5 grade 2 degenerative spondylolisthesis with critical stenosis and neurogenic claudication  Postoperative diagnosis: Same  Procedure Name: Bilateral L3-4 and L4-5 decompressive laminotomies and foraminotomies, more than would be required for simple interbody fusion alone.  Bilateral L3 andL4 Ponte osteotomies for sagittal plane correction  L3-4, L 45 posterior lumbar interbody fusion utilizing interbody cages and locally harvested autograft  L3-4-5 posterior lateral arthrodesis utilizing segmental pedicle screw fixation and local autograft  Surgeon:Augustus Zurawski A.Elexis Pollak, M.D.  Asst. Surgeon: Franky Macho  Anesthesia: General  Indication: 62 year old female with severe back and bilateral lower extremity pain paresthesias and weakness failing conservative management.  Work-up demonstrates evidence of grade 1 L3-4 degenerative spondylolisthesis with significant stenosis.  At L4-5 the patient has a severe grade 2 L4-5 degenerative spinal this with kyphotic angulation and critical spinal stenosis.  Patient presents now for decompression and fusion surgery in hopes of improving her symptoms.  Operative note: After induction anesthesia, patient position prone on the Wilson frame and properly padded.  Lumbar region prepped and draped sterilely.  Incision made from L3-L5.  Dissection performed bilaterally.  Retractor placed.  Fluoroscopy used.  Levels confirmed.  Decompressive laminotomies then performed using Leksell Wonda Cerise and a high-speed drill to remove the inferior two thirds of the lamina of L3 and L4 bilaterally the entire inferior facet of L3 and L4 bilaterally the majority the superior facet of L4 and L5 bilaterally.  Ponte osteotomies of L4 and L3 were completed in order to reduce the sagittal  plane deformity.  Ligament flavum was elevated and resected.  Foraminotomies were completed on the course exiting L3-L4 and L5 nerve roots bilaterally.  Bilateral discectomies then performed at L3-4.  The space was then distracted.  Disc space deformity at L4-5 was gently and sequentially reduced.  With retractors placed the patient's right side disc spaces were then prepared for interbody fusion.  Soft tissue was cleaned from the interspace.  At L4 5 to 10 mm Medtronic expandable cage packed with locally harvested autograft was impacted into place and expanded to its full extent.  At L3-4 and a 8 mm implant was impacted in place and expanded to its full extent.  Distractor removed patient's right side.  The space cleaned of all soft tissue.  Morselized autograft packed in the interspace.  Cages then impacted in the place and expanded to their full extent.  Pedicles of L3-L4 and L5 were then identified using surface landmarks and intraoperative fluoroscopy.  Superficial bone running the pedicle was then removed using high-speed drill.  Each pedicle was then probed using pedicle all each pedicle tract was then probed and found to be solidly within the bone.  Each pedicle tract was then tapped and then 5.75 mm radius bran screws from [employer] were placed bilaterally at L3-L4 and L5.  Short segment titanium rods in place to the screw heads.  Locking caps placed through the screws were locking caps and engaged with a construct under compression.  Final images reveal good position of the cages and the hardware at the proper operative level with marked improvement of patient's alignment.  Wound was then irrigated one final time.  Gelfoam was placed topically for hemostasis.  Transverse processes were decorticated.  Morselized autograft packed posterior laterally.  Vancomycin powder placed the deep wound space.  Wounds and closed in layers with Vicryl sutures.  Steri-Strips sterile  dressing were applied.  No  apparent complications.  Patient tolerated the procedure well and she returns to the recovery room postop.

## 2018-04-21 NOTE — Transfer of Care (Signed)
Immediate Anesthesia Transfer of Care Note  Patient: Sarah Woodward  Procedure(s) Performed: POSTERIOR LUMBAR INTERBODY FUSION - LUMBAR THREE-LUMBAR FOUR - LUMBAR FOUR-LUMBAR FIVE (N/A Back)  Patient Location: PACU  Anesthesia Type:General  Level of Consciousness: awake, alert  and oriented  Airway & Oxygen Therapy: Patient Spontanous Breathing and Patient connected to nasal cannula oxygen  Post-op Assessment: Report given to RN and Post -op Vital signs reviewed and stable  Post vital signs: Reviewed and stable  Last Vitals:  Vitals Value Taken Time  BP 116/73 04/21/2018  4:45 PM  Temp    Pulse 77 04/21/2018  4:49 PM  Resp 13 04/21/2018  4:49 PM  SpO2 100 % 04/21/2018  4:49 PM  Vitals shown include unvalidated device data.  Last Pain:  Vitals:   04/21/18 0939  TempSrc:   PainSc: 5          Complications: No apparent anesthesia complications

## 2018-04-21 NOTE — Anesthesia Procedure Notes (Signed)
Procedure Name: Intubation Date/Time: 04/21/2018 12:06 PM Performed by: Mariea Clonts, CRNA Pre-anesthesia Checklist: Patient identified, Emergency Drugs available, Suction available and Patient being monitored Patient Re-evaluated:Patient Re-evaluated prior to induction Oxygen Delivery Method: Circle System Utilized Preoxygenation: Pre-oxygenation with 100% oxygen Induction Type: IV induction Ventilation: Mask ventilation without difficulty Laryngoscope Size: Mac and 3 Grade View: Grade I Tube type: Oral Tube size: 7.5 mm Number of attempts: 1 Airway Equipment and Method: Stylet and Oral airway Placement Confirmation: ETT inserted through vocal cords under direct vision,  positive ETCO2 and breath sounds checked- equal and bilateral Tube secured with: Tape Dental Injury: Teeth and Oropharynx as per pre-operative assessment

## 2018-04-22 LAB — CBC
HCT: 25.7 % — ABNORMAL LOW (ref 36.0–46.0)
Hemoglobin: 8.4 g/dL — ABNORMAL LOW (ref 12.0–15.0)
MCH: 30.7 pg (ref 26.0–34.0)
MCHC: 32.7 g/dL (ref 30.0–36.0)
MCV: 93.8 fL (ref 78.0–100.0)
Platelets: 139 10*3/uL — ABNORMAL LOW (ref 150–400)
RBC: 2.74 MIL/uL — ABNORMAL LOW (ref 3.87–5.11)
RDW: 13.1 % (ref 11.5–15.5)
WBC: 4.9 10*3/uL (ref 4.0–10.5)

## 2018-04-22 LAB — BASIC METABOLIC PANEL
Anion gap: 11 (ref 5–15)
BUN: 24 mg/dL — ABNORMAL HIGH (ref 6–20)
CO2: 28 mmol/L (ref 22–32)
Calcium: 8.9 mg/dL (ref 8.9–10.3)
Chloride: 101 mmol/L (ref 98–111)
Creatinine, Ser: 1.97 mg/dL — ABNORMAL HIGH (ref 0.44–1.00)
GFR calc Af Amer: 31 mL/min — ABNORMAL LOW (ref >60)
GFR calc non Af Amer: 27 mL/min — ABNORMAL LOW (ref >60)
GLUCOSE: 109 mg/dL — AB (ref 70–99)
Potassium: 4.1 mmol/L (ref 3.5–5.1)
Sodium: 140 mmol/L (ref 135–145)

## 2018-04-22 MED ORDER — SODIUM CHLORIDE 0.9 % IV BOLUS
500.0000 mL | Freq: Once | INTRAVENOUS | Status: AC
  Administered 2018-04-22: 500 mL via INTRAVENOUS

## 2018-04-22 MED ORDER — SODIUM CHLORIDE 0.9 % IV SOLN
INTRAVENOUS | Status: DC
  Administered 2018-04-22 – 2018-04-24 (×3): via INTRAVENOUS

## 2018-04-22 MED FILL — Thrombin (Recombinant) For Soln 20000 Unit: CUTANEOUS | Qty: 1 | Status: AC

## 2018-04-22 NOTE — Progress Notes (Signed)
Patient arrived to 3W34 this time. Safety precautions and orders reviewed. Pt's b/p appears to be better. She c/o pain 10/10. Tramadol given. SCDs applied. Pt with low grade temp, ICS encourage at this time. MD called for continuous IVF since pt's without appetite. Tramadol without relief. Vicodin given per Mar. Will continue to monitor.  Sim BoastHavy, RN

## 2018-04-22 NOTE — Progress Notes (Signed)
Patient is transferred from room 3C07 to unit 3W34 at this time. Alert and in stable condition. Report given to receiving RN with all questions answered. Left unit via bed with all belongings at side.

## 2018-04-22 NOTE — Progress Notes (Signed)
Postop day 1.  Patient with significant orthostatic hypotension with ambulation today.  Back pain significant.  No lower extremity pain.  No symptoms of numbness or weakness.  Currently hypotensive with systolic pressure of 70.  Patient with heart rate is 69.  Patient awake talking and does not appear to be in an distress.  Abdomen is soft and nontender.  Motor and sensory function of the extremities normal.  Dressing clean and dry.  Status post 2 level lumbar decompression and fusion surgery.  Continue efforts at mobilization.  IV fluid bolus for hypotension and check CBC this morning.

## 2018-04-22 NOTE — Progress Notes (Signed)
Patient was ambulating in the hall way with PT and had a syncopal episode. Patient's face was flushed and assisted to a wheelchair. BP checked and noted to be 59/43 with HR at 49. Assisted to bed and MD came in room during his rounding and evaluated patient. Bolus fluids ordered and transfused. Patient is alert and responsive. PT/OT requested SNF thereby patient will be transferred to 3W for workup. Will continue to monitor at this time.

## 2018-04-22 NOTE — Progress Notes (Signed)
Inpatient Rehabilitation Admissions Coordinator  Noted PT and OT are recommending SNF for further rehab prior to d/c home with assist of niece. If pt would like to be also considered for an inpt rehab admit, please place order for rehab consult to assess if she would be a candidate. [employer] insurance. Please advise.  Ottie GlazierBarbara Ashlin Hidalgo, RN, MSN Rehab Admissions Coordinator [phone] 04/22/2018 3:34 PM

## 2018-04-22 NOTE — Progress Notes (Addendum)
Patient b/p continue to be low with IVF, pt verbalized that vicodin provide minimal relief. Pt is c/o pain 20/10 and appears tearful. Pt is febrile with 102 oral tempt at this time, blood pressure still in high 80s but asymptomatic. Tylenol given. Dr. Jordan LikesPool notify for other preventive measure. Continue to encourage ICS. MD ok for tylenol to be admin and continue to monitor pt.  Sim BoastHavy, RN

## 2018-04-22 NOTE — Evaluation (Signed)
Physical Therapy Evaluation Patient Details Name: Sarah Woodward MRN: [id number] DOB: 09/21/1957 Today's Date: 04/22/2018   History of Present Illness  62 yo female s/p Bilateral L3-L5 decompressive laminotomies and foraminotomies. PMH including asthma, HTN, Spondylolisthesis of lumbar region, and colon cancer.  Clinical Impression  Pt admitted with above diagnosis. Pt currently with functional limitations due to the deficits listed below (see PT Problem List). At the time of PT eval pt was able to perform transfers and ambulation with up to +2 min assist for balance support and safety. Pt reporting blurred vision and fatigue during gait training, and BP was found to be 59/43. Pt left in bed with RN present. Pt will benefit from skilled PT to increase their independence and safety with mobility to allow discharge to the venue listed below.       Follow Up Recommendations SNF;Supervision/Assistance - 24 hour    Equipment Recommendations  Rolling walker with 5" wheels    Recommendations for Other Services       Precautions / Restrictions Precautions Precautions: Fall;Back Precaution Booklet Issued: Yes (comment) Precaution Comments: Watch BP. Reviewed all back precautions and adherance during functional mobility Required Braces or Orthoses: Spinal Brace Spinal Brace: Lumbar corset Restrictions Weight Bearing Restrictions: No      Mobility  Bed Mobility Overal bed mobility: Needs Assistance Bed Mobility: Rolling;Sit to Sidelying Rolling: Min assist       Sit to sidelying: Min assist;+2 for physical assistance General bed mobility comments: +2 assist provided to get pt into supine as quickly and safely as possible due to low BP at end of session.   Transfers Overall transfer level: Needs assistance Equipment used: Rolling walker (2 wheeled) Transfers: Sit to/from Stand Sit to Stand: Min assist;+2 physical assistance;From elevated surface         General transfer  comment: +2 required for safe power-up to full standing position due to pain. VC's for hand placement on seated surface for safety.   Ambulation/Gait Ambulation/Gait assistance: Min assist Gait Distance (Feet): 50 Feet Assistive device: Rolling walker (2 wheeled) Gait Pattern/deviations: Decreased stride length;Shuffle;Antalgic Gait velocity: Decreased Gait velocity interpretation: <1.8 ft/sec, indicate of risk for recurrent falls General Gait Details: Pt landing flat footed with knee flexion, and snapping knee back into extension before taking the next step. Grossly antalgic, especially when stepping with the RLE. When attempting to turn, pt unable to advance feet and suddenly reported blurred vision. Seated rest and vitals taken. See above for details.   Stairs            Wheelchair Mobility    Modified Rankin (Stroke Patients Only)       Balance Overall balance assessment: Needs assistance Sitting-balance support: Bilateral upper extremity supported;No upper extremity supported;Feet supported Sitting balance-Leahy Scale: Fair     Standing balance support: No upper extremity supported;During functional activity;Bilateral upper extremity supported Standing balance-Leahy Scale: Poor Standing balance comment: Reliant on UE support and physical A                             Pertinent Vitals/Pain Pain Assessment: 0-10 Pain Score: 9  Pain Location: Back Pain Descriptors / Indicators: Constant;Discomfort;Grimacing;Guarding Pain Intervention(s): Monitored during session    Home Living Family/patient expects to be discharged to:: Private residence Living Arrangements: Alone Available Help at Discharge: Family;Available 24 hours/day(Niece will be staying at dc) Type of Home: House Home Access: Stairs to enter Entrance Stairs-Rails: Can reach both Entrance Stairs-Number of  Steps: 5 Home Layout: One level Home Equipment: Cane - single point      Prior Function  Level of Independence: Independent with assistive device(s)         Comments: ADLs, IADLs, driving, and working as a Research scientist (medical)facorty associate. SPC use before surgery.      Hand Dominance   Dominant Hand: Right    Extremity/Trunk Assessment   Upper Extremity Assessment Upper Extremity Assessment: Defer to OT evaluation    Lower Extremity Assessment Lower Extremity Assessment: Generalized weakness    Cervical / Trunk Assessment Cervical / Trunk Assessment: Other exceptions Cervical / Trunk Exceptions: s/p L3-5 decompression   Communication   Communication: No difficulties  Cognition Arousal/Alertness: Awake/alert(More lethargic at end of session due to low BP) Behavior During Therapy: WFL for tasks assessed/performed Overall Cognitive Status: Within Functional Limits for tasks assessed                                 General Comments: Slow moving      General Comments      Exercises     Assessment/Plan    PT Assessment Patient needs continued PT services  PT Problem List Decreased strength;Decreased activity tolerance;Decreased balance;Decreased mobility;Decreased coordination;Decreased knowledge of use of DME;Decreased safety awareness;Decreased cognition;Decreased knowledge of precautions;Pain       PT Treatment Interventions DME instruction;Gait training;Stair training;Functional mobility training;Therapeutic activities;Therapeutic exercise;Neuromuscular re-education;Patient/family education    PT Goals (Current goals can be found in the Care Plan section)  Acute Rehab PT Goals Patient Stated Goal: Pt did not state goals throughout session.  PT Goal Formulation: With patient Time For Goal Achievement: 05/06/18 Potential to Achieve Goals: Good    Frequency Min 5X/week   Barriers to discharge Decreased caregiver support Will have family available for 1 week and then will be alone.     Co-evaluation               AM-PAC PT "6 Clicks" Daily  Activity  Outcome Measure Difficulty turning over in bed (including adjusting bedclothes, sheets and blankets)?: Unable Difficulty moving from lying on back to sitting on the side of the bed? : Unable Difficulty sitting down on and standing up from a chair with arms (e.g., wheelchair, bedside commode, etc,.)?: Unable Help needed moving to and from a bed to chair (including a wheelchair)?: A Little Help needed walking in hospital room?: A Little Help needed climbing 3-5 steps with a railing? : Total 6 Click Score: 10    End of Session Equipment Utilized During Treatment: Gait belt Activity Tolerance: Treatment limited secondary to medical complications (Comment)(Orthostatic hypotension) Patient left: in bed;with call bell/phone within reach;with nursing/sitter in room Nurse Communication: Mobility status PT Visit Diagnosis: Unsteadiness on feet (R26.81);Pain;Difficulty in walking, not elsewhere classified (R26.2) Pain - part of body: (back)    Time: [id number] PT Time Calculation (min) (ACUTE ONLY): 23 min   Charges:   PT Evaluation $PT Eval Moderate Complexity: 1 Mod PT Treatments $Gait Training: 8-22 mins        Conni SlipperLaura Narelle Schoening, PT, DPT Acute Rehabilitation Services Pager: [phone]   Marylynn PearsonLaura D Sumiya Mamaril 04/22/2018, 11:02 AM

## 2018-04-22 NOTE — Evaluation (Signed)
Occupational Therapy Evaluation Patient Details Name: Sarah Woodward MRN: [id number] DOB: 09/21/1957 Today's Date: 04/22/2018    History of Present Illness 62 yo female s/p Bilateral L3-4 and L4-5 decompressive laminotomies and foraminotomies. PMH including asthma, HTN, Spondylolisthesis of lumbar region, and colon cancer.   Clinical Impression   PTA, pt was living alone and was independent with ADLs, IADLs, driving, and working as a Clinical cytogeneticistfactory associate. Pt reports that her niece is planning to stay at dc. Pt currently requiring Min A for UB ADLs, Mod A for LB ADLs with AE, and Min-Mod A for functional mobility with RW. Pt presenting with decreased balance, strength, and activity tolerance. Pt also limited by significant pain and at risk for falls. Pt will require further acute OT to address LB ADLs, toileting, and shower transfer as well as facilitate safe dc. Recommend dc to SNF for further OT to optimize safety, independence with ADLs, and return to PLOF prior to transitioning home.      Follow Up Recommendations  SNF;Supervision/Assistance - 24 hour    Equipment Recommendations  3 in 1 bedside commode    Recommendations for Other Services PT consult     Precautions / Restrictions Precautions Precautions: Back Precaution Booklet Issued: Yes (comment) Precaution Comments: Rweviewed all back precautions and adherance during ADLs. Required Braces or Orthoses: Spinal Brace Spinal Brace: Lumbar corset Restrictions Weight Bearing Restrictions: No      Mobility Bed Mobility Overal bed mobility: Needs Assistance Bed Mobility: Rolling;Sidelying to Sit Rolling: Mod assist Sidelying to sit: Mod assist       General bed mobility comments: Max cues for technique. Mod A for rolling to right and pt presenting with decreased initation. Mod A to elevate trunk.  Transfers Overall transfer level: Needs assistance Equipment used: Rolling walker (2 wheeled);None Transfers: Sit to/from  Stand Sit to Stand: Mod assist;Min assist;+2 physical assistance         General transfer comment: Mod A to power up into standing without DME. Min A +2 for power up a second time from EOB and then to gain balance in standing.     Balance Overall balance assessment: Needs assistance Sitting-balance support: Bilateral upper extremity supported;No upper extremity supported;Feet supported Sitting balance-Leahy Scale: Fair     Standing balance support: No upper extremity supported;During functional activity;Bilateral upper extremity supported Standing balance-Leahy Scale: Poor Standing balance comment: Reliant on UE support and physical A                           ADL either performed or assessed with clinical judgement   ADL Overall ADL's : Needs assistance/impaired Eating/Feeding: Set up;Sitting   Grooming: Set up;Sitting   Upper Body Bathing: Minimal assistance;Sitting   Lower Body Bathing: Maximal assistance;Sit to/from stand   Upper Body Dressing : Minimal assistance;Sitting Upper Body Dressing Details (indicate cue type and reason): Min A for donning second gown and brace. Providing education on brace management. Lower Body Dressing: Moderate assistance;Sit to/from stand;With adaptive equipment Lower Body Dressing Details (indicate cue type and reason): Pt donning underwear with reacher. Min A for Engineer, maintenance (IT)managing reacher. Mod A for standing balance with significant posterior lean.              Functional mobility during ADLs: Moderate assistance;Minimal assistance;Rolling walker(Mod A without DME. Min A with RW. ) General ADL Comments: Pt demosntrating poor strength, balance, and awareness. Pt presenting with posterior lean during dyanmic balance. Benefits fro mRW for UE support.  Vision         Perception     Praxis      Pertinent Vitals/Pain Pain Assessment: 0-10 Pain Score: 9  Pain Location: Back Pain Descriptors / Indicators:  Constant;Discomfort;Grimacing;Guarding Pain Intervention(s): Monitored during session;Limited activity within patient's tolerance;Repositioned     Hand Dominance Right   Extremity/Trunk Assessment Upper Extremity Assessment Upper Extremity Assessment: Overall WFL for tasks assessed   Lower Extremity Assessment Lower Extremity Assessment: Defer to PT evaluation   Cervical / Trunk Assessment Cervical / Trunk Assessment: Other exceptions Cervical / Trunk Exceptions: s/p L3-5 decompressive    Communication Communication Communication: No difficulties   Cognition Arousal/Alertness: Awake/alert Behavior During Therapy: WFL for tasks assessed/performed Overall Cognitive Status: Within Functional Limits for tasks assessed                                 General Comments: Slow moving   General Comments       Exercises     Shoulder Instructions      Home Living Family/patient expects to be discharged to:: Private residence Living Arrangements: Alone Available Help at Discharge: Family;Available 24 hours/day(Niece will be staying at dc) Type of Home: House Home Access: Stairs to enter [employer] of Steps: 5 Entrance Stairs-Rails: Can reach both Home Layout: One level     Bathroom Shower/Tub: Tub/shower unit;Walk-in shower;Curtain   Bathroom Toilet: Standard     Home Equipment: Cane - single point          Prior Functioning/Environment Level of Independence: Independent        Comments: ADLs, IADLs, driving, and working as a Research scientist (medical).        OT Problem List: Decreased strength;Decreased range of motion;Decreased activity tolerance;Impaired balance (sitting and/or standing);Decreased safety awareness;Decreased knowledge of use of DME or AE;Decreased knowledge of precautions;Pain      OT Treatment/Interventions: Self-care/ADL training;Therapeutic exercise;Energy conservation;DME and/or AE instruction;Therapeutic  activities;Patient/family education    OT Goals(Current goals can be found in the care plan section) Acute Rehab OT Goals Patient Stated Goal: "stop the pain" OT Goal Formulation: With patient Time For Goal Achievement: 05/06/18 Potential to Achieve Goals: Good ADL Goals Pt Will Perform Grooming: with min guard assist;standing Pt Will Perform Lower Body Dressing: with min guard assist;sit to/from stand;with adaptive equipment Pt Will Transfer to Toilet: with min guard assist;ambulating;bedside commode Pt Will Perform Toileting - Clothing Manipulation and hygiene: with min guard assist;sit to/from stand Pt Will Perform Tub/Shower Transfer: Shower transfer;with min guard assist;3 in 1;rolling walker;ambulating  OT Frequency: Min 2X/week   Barriers to D/C:            Co-evaluation              AM-PAC PT "6 Clicks" Daily Activity     Outcome Measure Help from another person eating meals?: None Help from another person taking care of personal grooming?: A Little Help from another person toileting, which includes using toliet, bedpan, or urinal?: A Lot Help from another person bathing (including washing, rinsing, drying)?: A Lot Help from another person to put on and taking off regular upper body clothing?: A Little Help from another person to put on and taking off regular lower body clothing?: A Lot 6 Click Score: 16   End of Session Equipment Utilized During Treatment: Gait belt;Back brace;Rolling walker Nurse Communication: Mobility status;Precautions;Weight bearing status;Other (comment)(Decreased activity tolerance and need for post-acute OT)  Activity Tolerance: Patient limited  by pain;Patient limited by fatigue Patient left: (with PT walking in hallway)  OT Visit Diagnosis: Unsteadiness on feet (R26.81);Other abnormalities of gait and mobility (R26.89);Muscle weakness (generalized) (M62.81);Pain Pain - part of body: (Back)                Time: 4098-1191 OT Time  Calculation (min): 36 min Charges:  OT General Charges $OT Visit: 1 Visit OT Evaluation $OT Eval Moderate Complexity: 1 Mod OT Treatments $Self Care/Home Management : 8-22 mins  Jyllian Haynie MSOT, OTR/L Acute Rehab Pager: [phone] Office: [phone]  Theodoro Grist Sherilynn Dieu 04/22/2018, 8:42 AM

## 2018-04-22 NOTE — Progress Notes (Signed)
Patient verbalized that the combination of oxy IR and tylenol seem to relieve her pain at this time. Pt noted more comfortable and relaxed in bed currently, fever noted to be better. Continue to encourage ICS use. Blood pressure stable in the high 80s. Will continue to monitor.  Sim BoastHavy, RN

## 2018-04-23 ENCOUNTER — Inpatient Hospital Stay (HOSPITAL_COMMUNITY): Payer: BLUE CROSS/BLUE SHIELD

## 2018-04-23 LAB — CBC
HCT: 24.4 % — ABNORMAL LOW (ref 36.0–46.0)
HEMATOCRIT: 30.5 % — AB (ref 36.0–46.0)
HEMOGLOBIN: 7.8 g/dL — AB (ref 12.0–15.0)
HEMOGLOBIN: 9.8 g/dL — AB (ref 12.0–15.0)
MCH: 30.5 pg (ref 26.0–34.0)
MCH: 30.3 pg (ref 26.0–34.0)
MCHC: 32 g/dL (ref 30.0–36.0)
MCHC: 32.1 g/dL (ref 30.0–36.0)
MCV: 95.3 fL (ref 78.0–100.0)
MCV: 94.4 fL (ref 78.0–100.0)
PLATELETS: 130 10*3/uL — AB (ref 150–400)
Platelets: 131 10*3/uL — ABNORMAL LOW (ref 150–400)
RBC: 2.56 MIL/uL — AB (ref 3.87–5.11)
RBC: 3.23 MIL/uL — ABNORMAL LOW (ref 3.87–5.11)
RDW: 12.9 % (ref 11.5–15.5)
RDW: 13.2 % (ref 11.5–15.5)
WBC: 4.6 10*3/uL (ref 4.0–10.5)
WBC: 6.2 10*3/uL (ref 4.0–10.5)

## 2018-04-23 LAB — BASIC METABOLIC PANEL
ANION GAP: 7 (ref 5–15)
BUN: 20 mg/dL (ref 6–20)
CHLORIDE: 103 mmol/L (ref 98–111)
CO2: 25 mmol/L (ref 22–32)
Calcium: 8 mg/dL — ABNORMAL LOW (ref 8.9–10.3)
Creatinine, Ser: 1.36 mg/dL — ABNORMAL HIGH (ref 0.44–1.00)
GFR calc Af Amer: 48 mL/min — ABNORMAL LOW (ref >60)
GFR, EST NON AFRICAN AMERICAN: 42 mL/min — AB (ref >60)
GLUCOSE: 140 mg/dL — AB (ref 70–99)
POTASSIUM: 3.9 mmol/L (ref 3.5–5.1)
Sodium: 135 mmol/L (ref 135–145)

## 2018-04-23 LAB — PREPARE RBC (CROSSMATCH)

## 2018-04-23 MED ORDER — SODIUM CHLORIDE 0.9% IV SOLUTION
Freq: Once | INTRAVENOUS | Status: AC
  Administered 2018-04-23: 04:00:00 via INTRAVENOUS

## 2018-04-23 NOTE — Progress Notes (Signed)
Looks better today.  Back pain controlled.  No lower extremity pain.  No abdominal pain.  Afebrile.  Heart rate 76.  Respirations normal.  Blood pressure 94/65.  Urine output good.  Awake and alert.  Oriented and appropriate.  Motor and sensory function intact.  Abdomen soft.  Overall progressing reasonably well following 2 level lumbar decompression and fusion.  Patient with postoperative blood loss anemia which has responded to a single unit of packed cells.  Continue efforts at mobilization.  Possible discharge home tomorrow if patient feels up to it.

## 2018-04-23 NOTE — Progress Notes (Signed)
Per Pete GlatterKim Meyan, NP, consent and type and screen confirmed and orders placed to transfuse one unit PRBCs due to Hgb<8 at 7.8. Pt educated and verbalizes understanding.

## 2018-04-23 NOTE — NC FL2 (Signed)
[place] MEDICAID FL2 LEVEL OF CARE SCREENING TOOL     IDENTIFICATION  Patient Name: Sarah Woodward Birthdate: 09/21/1957 Sex: female Admission Date (Current Location): 04/21/2018  [facility] and [place] Number:  [employer] and Address:  [facility], [address] [place], [place] [address]      Provider Number: [id number]  Attending Physician Name and Address:  Julio Sicks, MD  Relative Name and Phone Number:       Current Level of Care: Hospital Recommended Level of Care: Skilled Nursing Facility Prior Approval Number:    Date Approved/Denied:   PASRR Number: [id number]  Discharge Plan: SNF    Current Diagnoses: Patient Active Problem List   Diagnosis Date Noted  . Degenerative spondylolisthesis 04/21/2018    Orientation RESPIRATION BLADDER Height & Weight     Self, Time, Situation, Place  Normal Continent Weight: 180 lb 10 oz (81.9 kg) Height:  5\' 7"  (170.2 cm)  BEHAVIORAL SYMPTOMS/MOOD NEUROLOGICAL BOWEL NUTRITION STATUS      Continent Diet(regular)  AMBULATORY STATUS COMMUNICATION OF NEEDS Skin   Limited Assist Verbally Surgical wounds(closed back incision (04/21/18), honeycomb dressing)                       Personal Care Assistance Level of Assistance  Bathing, Feeding, Dressing Bathing Assistance: Limited assistance Feeding assistance: Independent Dressing Assistance: Limited assistance     Functional Limitations Info  Sight, Hearing, Speech Sight Info: Adequate Hearing Info: Adequate Speech Info: Adequate    SPECIAL CARE FACTORS FREQUENCY  PT (By licensed PT), OT (By licensed OT)     PT Frequency: 5x/wk OT Frequency: 5x/wk            Contractures Contractures Info: Not present    Additional Factors Info  Code Status, Allergies, Psychotropic Code Status Info: Full Allergies Info: Codeine Psychotropic Info: Cymbalta 30mg  2x/day         Current Medications (04/23/2018):  This is the  current hospital active medication list Current Facility-Administered Medications  Medication Dose Route Frequency Provider Last Rate Last Dose  . 0.9 %  sodium chloride infusion   Intravenous Continuous Julio Sicks, MD 50 mL/hr at 04/23/18 1052    . acetaminophen (TYLENOL) tablet 650 mg  650 mg Oral Q4H PRN Julio Sicks, MD   650 mg at 04/23/18 0539   Or  . acetaminophen (TYLENOL) suppository 650 mg  650 mg Rectal Q4H PRN Julio Sicks, MD      . atorvastatin (LIPITOR) tablet 10 mg  10 mg Oral Daily Julio Sicks, MD   10 mg at 04/23/18 1041  . bisacodyl (DULCOLAX) suppository 10 mg  10 mg Rectal Daily PRN Julio Sicks, MD      . cholecalciferol (VITAMIN D) tablet 400 Units  400 Units Oral Daily Julio Sicks, MD   400 Units at 04/23/18 1138  . diazepam (VALIUM) tablet 5-10 mg  5-10 mg Oral Q6H PRN Julio Sicks, MD   5 mg at 04/22/18 0253  . DULoxetine (CYMBALTA) DR capsule 30 mg  30 mg Oral BID Julio Sicks, MD   30 mg at 04/23/18 1041  . ferrous sulfate tablet 325 mg  325 mg Oral Q breakfast Julio Sicks, MD   325 mg at 04/23/18 1041  . furosemide (LASIX) tablet 40 mg  40 mg Oral Daily Julio Sicks, MD   40 mg at 04/23/18 1041  . gabapentin (NEURONTIN) tablet 600 mg  600 mg Oral TID Julio Sicks, MD  600 mg at 04/23/18 1514  . HYDROcodone-acetaminophen (NORCO) 10-325 MG per tablet 1 tablet  1 tablet Oral Q4H PRN Julio SicksPool, Henry, MD   1 tablet at 04/23/18 0117  . HYDROmorphone (DILAUDID) injection 1 mg  1 mg Intravenous Q2H PRN Julio SicksPool, Henry, MD   1 mg at 04/22/18 [id number]  . lisinopril (PRINIVIL,ZESTRIL) tablet 40 mg  40 mg Oral Daily Pool, Sherilyn CooterHenry, MD      . menthol-cetylpyridinium (CEPACOL) lozenge 3 mg  1 lozenge Oral PRN Julio SicksPool, Henry, MD       Or  . phenol (CHLORASEPTIC) mouth spray 1 spray  1 spray Mouth/Throat PRN Julio SicksPool, Henry, MD      . ondansetron [facility](ZOFRAN) tablet 4 mg  4 mg Oral Q6H PRN Julio SicksPool, Henry, MD   4 mg at 04/23/18 0328   Or  . ondansetron (ZOFRAN) injection 4 mg  4 mg Intravenous Q6H PRN Julio SicksPool, Henry,  MD      . oxyCODONE (Oxy IR/ROXICODONE) immediate release tablet 10 mg  10 mg Oral Q3H PRN Julio SicksPool, Henry, MD   10 mg at 04/23/18 1514  . polyethylene glycol (MIRALAX / GLYCOLAX) packet 17 g  17 g Oral Daily PRN Julio SicksPool, Henry, MD      . sodium phosphate (FLEET) 7-19 GM/118ML enema 1 enema  1 enema Rectal Once PRN Julio SicksPool, Henry, MD      . traMADol Janean Sark(ULTRAM) tablet 50 mg  50 mg Oral TID PRN Julio SicksPool, Henry, MD   50 mg at 04/23/18 1137  . vitamin B-12 (CYANOCOBALAMIN) tablet 1,000 mcg  1,000 mcg Oral Daily Julio SicksPool, Henry, MD   1,000 mcg at 04/23/18 1041  . vitamin C (ASCORBIC ACID) tablet 500 mg  500 mg Oral Daily Julio SicksPool, Henry, MD   500 mg at 04/23/18 1041  . vitamin E capsule 400 Units  400 Units Oral Daily Julio SicksPool, Henry, MD   400 Units at 04/23/18 1138     Discharge Medications: Please see discharge summary for a list of discharge medications.  Relevant Imaging Results:  Relevant Lab Results:   Additional Information SS#: [id number]  Baldemar LenisElizabeth M Muhamed Luecke, LCSW

## 2018-04-23 NOTE — Progress Notes (Signed)
Physical Therapy Treatment Patient Details Name: Sarah Woodward MRN: [id number] DOB: 09/21/1957 Today's Date: 04/23/2018    History of Present Illness 62 yo female s/p Bilateral L3-L5 decompressive laminotomies and foraminotomies. PMH including asthma, HTN, Spondylolisthesis of lumbar region, and colon cancer.    PT Comments    Patient is making progress toward PT goals and tolerated increased activity this session. Pt reports decreased pain today. Pt does require assistance for functional transfers and gait and with one LOB laterally to R side when ambulating. Continue to progress as tolerated with anticipated d/c to SNF for further skilled PT services.      Follow Up Recommendations  SNF;Supervision/Assistance - 24 hour     Equipment Recommendations  Rolling walker with 5" wheels    Recommendations for Other Services       Precautions / Restrictions Precautions Precautions: Fall;Back Precaution Comments: 3/3 precautions reviewed with pt; pt unable to recall precautions  Required Braces or Orthoses: Spinal Brace Spinal Brace: Lumbar corset Restrictions Weight Bearing Restrictions: No    Mobility  Bed Mobility Overal bed mobility: Needs Assistance Bed Mobility: Rolling;Sit to Sidelying Rolling: Min guard       Sit to sidelying: Mod assist General bed mobility comments: assistance required to bring bilat LE into bed; cues for sequencing and technique  Transfers Overall transfer level: Needs assistance Equipment used: Rolling walker (2 wheeled) Transfers: Sit to/from Stand Sit to Stand: Min assist         General transfer comment: assist to power up and to steady; cues for safe hand placement  Ambulation/Gait Ambulation/Gait assistance: Min guard;Min assist Gait Distance (Feet): (hallway ambulation) Assistive device: Rolling walker (2 wheeled) Gait Pattern/deviations: Decreased stride length;Step-through pattern;Decreased dorsiflexion - right;Decreased  dorsiflexion - left;Steppage Gait velocity: Decreased   General Gait Details: pt with steppage gait especially noted with R LE; cues for posture; pt with one LOB laterally to R side; pt tends to maintain slight bilat knee flexion unless snapped back into extension in stance phase   Stairs             Wheelchair Mobility    Modified Rankin (Stroke Patients Only)       Balance Overall balance assessment: Needs assistance Sitting-balance support: Bilateral upper extremity supported;No upper extremity supported;Feet supported Sitting balance-Leahy Scale: Fair     Standing balance support: During functional activity;Bilateral upper extremity supported Standing balance-Leahy Scale: Poor                              Cognition Arousal/Alertness: Awake/alert Behavior During Therapy: WFL for tasks assessed/performed Overall Cognitive Status: Within Functional Limits for tasks assessed                                        Exercises      General Comments        Pertinent Vitals/Pain Pain Assessment: Faces Faces Pain Scale: Hurts little more Pain Location: Back Pain Descriptors / Indicators: Guarding;Sore Pain Intervention(s): Limited activity within patient's tolerance;Monitored during session;Repositioned    Home Living                      Prior Function            PT Goals (current goals can now be found in the care plan section) Acute Rehab PT Goals PT Goal  Formulation: With patient Time For Goal Achievement: 05/06/18 Potential to Achieve Goals: Good Progress towards PT goals: Progressing toward goals    Frequency    Min 5X/week      PT Plan Current plan remains appropriate    Co-evaluation              AM-PAC PT "6 Clicks" Daily Activity  Outcome Measure  Difficulty turning over in bed (including adjusting bedclothes, sheets and blankets)?: Unable Difficulty moving from lying on back to sitting on  the side of the bed? : Unable Difficulty sitting down on and standing up from a chair with arms (e.g., wheelchair, bedside commode, etc,.)?: Unable Help needed moving to and from a bed to chair (including a wheelchair)?: A Little Help needed walking in hospital room?: A Little Help needed climbing 3-5 steps with a railing? : Total 6 Click Score: 10    End of Session Equipment Utilized During Treatment: Gait belt Activity Tolerance: Patient tolerated treatment well Patient left: in bed;with call bell/phone within reach Nurse Communication: Mobility status PT Visit Diagnosis: Unsteadiness on feet (R26.81);Pain;Difficulty in walking, not elsewhere classified (R26.2) Pain - part of body: (back)     Time: 1000-1025 PT Time Calculation (min) (ACUTE ONLY): 25 min  Charges:  $Gait Training: 8-22 mins $Therapeutic Activity: 8-22 mins                     Erline LevineKellyn Dewana Ammirati, PTA Pager: [phone]     Carolynne EdouardKellyn R Alfrieda Tarry 04/23/2018, 12:18 PM

## 2018-04-23 NOTE — Clinical Social Work Note (Signed)
Clinical Social Work Assessment  Patient Details  Name: Sarah Woodward MRN: [id number] Date of Birth: 09/21/1957  Date of referral:  04/23/18               Reason for consult:  Facility Placement                Permission sought to share information with:  Facility Art therapist granted to share information::  Yes, Verbal Permission Granted  Name::        Agency::  SNF  Relationship::     Contact Information:     Housing/Transportation Living arrangements for the past 2 months:  Single Family Home Source of Information:  Patient Patient Interpreter Needed:  None Criminal Activity/Legal Involvement Pertinent to Current Situation/Hospitalization:  No - Comment as needed Significant Relationships:  Siblings, Other Family Members Lives with:  Self Do you feel safe going back to the place where you live?  Yes Need for family participation in patient care:  No (Coment)  Care giving concerns:  Patient from home alone, would benefit from short term rehab to improve strength prior to returning home.   Social Worker assessment / plan:  CSW met with patient to discuss therapy recommendation for SNF placement. CSW discussed what that meant, and expectations, and received permission to fax out referral. CSW to follow up with bed offers.  Employment status:    Forensic scientist:  Managed Care PT Recommendations:  [place] / Referral to community resources:  [place]  Patient/Family's Response to care:  Patient agreeable to SNF placement.  Patient/Family's Understanding of and Emotional Response to Diagnosis, Current Treatment, and Prognosis:  Patient seemed confused at first about what SNF placement meant, but once she understood then she said that she would feel better doing that then going straight home. Patient said she didn't know anything about any of the facilities, but that she wanted to go to "the best one" that  would be available.   Emotional Assessment Appearance:  Appears stated age Attitude/Demeanor/Rapport:  Engaged Affect (typically observed):  Pleasant Orientation:  Oriented to Self, Oriented to Place, Oriented to  Time, Oriented to Situation Alcohol / Substance use:  Not Applicable Psych involvement (Current and /or in the community):  No (Comment)  Discharge Needs  Concerns to be addressed:  Care Coordination Readmission within the last 30 days:  No Current discharge risk:  Dependent with Mobility, Lives alone Barriers to Discharge:  Continued Medical Work up, [place], [place] 04/23/2018, 4:40 PM

## 2018-04-24 LAB — TYPE AND SCREEN
ABO/RH(D): B POS
Antibody Screen: NEGATIVE
Unit division: 0

## 2018-04-24 LAB — BPAM RBC
BLOOD PRODUCT EXPIRATION DATE: 201908242359
ISSUE DATE / TIME: 201908010350
Unit Type and Rh: 7300

## 2018-04-24 MED ORDER — HYDROCODONE-ACETAMINOPHEN 10-325 MG PO TABS: 1.0000 | ORAL_TABLET | ORAL | 0 refills | Status: AC | PRN

## 2018-04-24 MED ORDER — DIAZEPAM 5 MG PO TABS: 5.0000 mg | ORAL_TABLET | Freq: Four times a day (QID) | ORAL | 0 refills | Status: AC | PRN

## 2018-04-24 MED FILL — Heparin Sodium (Porcine) Inj 1000 Unit/ML: INTRAMUSCULAR | Qty: 30 | Status: AC

## 2018-04-24 MED FILL — Sodium Chloride IV Soln 0.9%: INTRAVENOUS | Qty: 1000 | Status: AC

## 2018-04-24 NOTE — Progress Notes (Signed)
Overall doing well.  Hypertension essentially resolved.  Patient making progress with therapy but is not appropriate for discharge home.  Plan for discharge to skilled nursing facility when bed available.

## 2018-04-24 NOTE — Progress Notes (Signed)
Physical Therapy Treatment Patient Details Name: Sarah Woodward MRN: [id number] DOB: 09/21/1957 Today's Date: 04/24/2018    History of Present Illness 62 yo female s/p Bilateral L3-L5 decompressive laminotomies and foraminotomies. PMH including asthma, HTN, Spondylolisthesis of lumbar region, and colon cancer.    PT Comments    Patient doing well and motivated to work with PT. Patient continues to require physical assist for bed mobility, transfers and gait. Flexed posture in stance position with consistent verbal cueing for safety and sequencing. PT recommendations remain appropriate with PT to continue to follow acutely.     Follow Up Recommendations  SNF;Supervision/Assistance - 24 hour     Equipment Recommendations  Rolling walker with 5" wheels    Recommendations for Other Services       Precautions / Restrictions Precautions Precautions: Fall;Back Precaution Comments: reviewed precautions with patient and brace wear schedule Required Braces or Orthoses: Spinal Brace Spinal Brace: Lumbar corset;Applied in sitting position Restrictions Weight Bearing Restrictions: No    Mobility  Bed Mobility Overal bed mobility: Needs Assistance Bed Mobility: Rolling;Sidelying to Sit Rolling: Min guard Sidelying to sit: Min assist       General bed mobility comments: Min A for trunk control  Transfers Overall transfer level: Needs assistance Equipment used: Rolling walker (2 wheeled) Transfers: Sit to/from Stand Sit to Stand: Min assist         General transfer comment: Min A to power up at bedside; knee blocking to prevent buckling; prefers a flexed posture in stance position  Ambulation/Gait Ambulation/Gait assistance: Min guard Gait Distance (Feet): (hallway ambulation) Assistive device: Rolling walker (2 wheeled) Gait Pattern/deviations: Step-through pattern;Decreased stride length;Steppage;Trunk flexed Gait velocity: Decreased   General Gait Details: continued  steppage pattern along with flexed B knees in stance positioning. verbal cueing to increase stride length and safety with turning   Stairs             Wheelchair Mobility    Modified Rankin (Stroke Patients Only)       Balance Overall balance assessment: Needs assistance Sitting-balance support: Bilateral upper extremity supported;No upper extremity supported;Feet supported Sitting balance-Leahy Scale: Fair     Standing balance support: During functional activity;Bilateral upper extremity supported Standing balance-Leahy Scale: Poor Standing balance comment: reliant on RW                            Cognition Arousal/Alertness: Awake/alert Behavior During Therapy: WFL for tasks assessed/performed Overall Cognitive Status: Within Functional Limits for tasks assessed                                        Exercises      General Comments        Pertinent Vitals/Pain Pain Assessment: Faces Faces Pain Scale: Hurts a little bit Pain Location: Back Pain Descriptors / Indicators: Sore Pain Intervention(s): Limited activity within patient's tolerance;Monitored during session    Home Living                      Prior Function            PT Goals (current goals can now be found in the care plan section) Acute Rehab PT Goals Patient Stated Goal: none stated PT Goal Formulation: With patient Time For Goal Achievement: 05/06/18 Potential to Achieve Goals: Good Progress towards PT goals: Progressing toward goals  Frequency    Min 5X/week      PT Plan Current plan remains appropriate    Co-evaluation              AM-PAC PT "6 Clicks" Daily Activity  Outcome Measure  Difficulty turning over in bed (including adjusting bedclothes, sheets and blankets)?: Unable Difficulty moving from lying on back to sitting on the side of the bed? : Unable Difficulty sitting down on and standing up from a chair with arms (e.g.,  wheelchair, bedside commode, etc,.)?: Unable Help needed moving to and from a bed to chair (including a wheelchair)?: A Little Help needed walking in hospital room?: A Little Help needed climbing 3-5 steps with a railing? : A Lot 6 Click Score: 11    End of Session Equipment Utilized During Treatment: Gait belt;Back brace Activity Tolerance: Patient tolerated treatment well Patient left: in chair;with call bell/phone within reach;with nursing/sitter in room Nurse Communication: Mobility status PT Visit Diagnosis: Unsteadiness on feet (R26.81);Pain;Difficulty in walking, not elsewhere classified (R26.2)     Time: 7829-5621 PT Time Calculation (min) (ACUTE ONLY): 24 min  Charges:  $Gait Training: 8-22 mins $Therapeutic Activity: 8-22 mins                    Kipp Laurence, PT, DPT 04/24/18 12:51 PM Pager: [phone]

## 2018-04-24 NOTE — Discharge Instructions (Signed)

## 2018-04-24 NOTE — Progress Notes (Signed)
Inpatient Rehabilitation Admissions Coordinator  Noted pt min assist in hallways with therapy with plans for d/c to SNF for continued rehab. I will sign off as SNF venue appropriate.  Ottie GlazierBarbara Doye Montilla, RN, MSN Rehab Admissions Coordinator [phone] 04/24/2018 2:39 PM

## 2018-04-24 NOTE — Discharge Summary (Addendum)
Physician Discharge Summary  Patient ID: Sarah Woodward MRN: [id number] DOB/AGE: 61/30/1958 61 y.o.  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  Postoperatively she is doing well.  Preoperative back and lower extremity pain much improved.  She is standing and walking with therapy.  She has had some transient difficulty with hypotension from acute blood loss anemia.  This is responded well to one single unit of blood transfusion.  Currently the patient's hypotension is resolved.  She is progressing well with therapy.  They have recommended additional therapy in a skilled nursing facility during her convalescence.  We will make arrangements for this.  Consults:   Significant Diagnostic Studies:   Treatments:   Discharge Exam: Blood pressure 94/67, pulse 70, temperature 99.4 F (37.4 C), temperature source Oral, resp. rate 15, height 5\' 7"  (1.702 m), weight 81.9 kg (180 lb 10 oz), SpO2 92 %. Awake and alert.  Oriented and appropriate.  Cranial nerve function intact.  Motor and sensory function extremities normal.  Wound clean and dry.  Chest and abdomen benign.  Disposition: Discharge disposition: 03-Skilled Nursing Facility        Allergies as of 04/24/2018      Reactions   Codeine Nausea Only      Medication List    STOP taking these medications   nabumetone 750 MG tablet Commonly known as:  RELAFEN     TAKE these medications   acetaminophen 500 MG tablet Commonly known as:  TYLENOL Take 1,000 mg by mouth daily as needed for mild pain.   aspirin 325 MG tablet Take 325 mg by mouth daily.   atorvastatin 10 MG  tablet Commonly known as:  LIPITOR Take 10 mg by mouth daily.   Biotin 5000 MCG Tabs Take 5,000 mcg by mouth daily.   diazepam 5 MG tablet Commonly known as:  VALIUM Take 1-2 tablets (5-10 mg total) by mouth every 6 (six) hours as needed for muscle spasms.   DULoxetine 30 MG capsule Commonly known as:  CYMBALTA Take 30 mg by mouth 2 (two) times daily.   furosemide 40 MG tablet Commonly known as:  LASIX Take 40 mg by mouth daily.   gabapentin 600 MG tablet Commonly known as:  NEURONTIN Take 600 mg by mouth 3 (three) times daily.   Garlic 1000 MG Caps Take 1,000 mg by mouth daily.   HYDROcodone-acetaminophen 10-325 MG tablet Commonly known as:  NORCO Take 1-2 tablets by mouth every 4 (four) hours as needed for moderate pain ((score 4 to 6)).   IRON SUPPLEMENT 325 (65 FE) MG tablet Generic drug:  ferrous sulfate Take 325 mg by mouth daily with breakfast.   lisinopril 40 MG tablet Commonly known as:  PRINIVIL,ZESTRIL Take 40 mg by mouth daily.   MEGARED OMEGA-3 KRILL OIL 500 MG Caps Take 1 capsule by mouth daily.   traMADol 50 MG tablet Commonly known as:  ULTRAM Take 50 mg by mouth 3 (three) times daily as needed for moderate pain.   VITAMIN A PO Take 1 tablet by mouth daily. 2400 MG   vitamin B-12 1000 MCG tablet Commonly known as:  CYANOCOBALAMIN Take 1,000 mcg by mouth daily.   vitamin C 500 MG tablet Commonly known as:  ASCORBIC ACID Take 500 mg by mouth daily.   Vitamin D3 400 units Caps Take 1 capsule by mouth daily.   vitamin E 400 UNIT capsule Take 400 Units by mouth daily.            Durable Medical Equipment  (From admission, onward)        Start     Ordered   04/21/18 1755  DME Walker rolling  Once    Question:  Patient needs a walker to treat with the following condition  Answer:  Degenerative spondylolisthesis   04/21/18 1754   04/21/18 1755  DME 3 n 1  Once     04/21/18 1754       Signed: Sherilyn Cooter A Brittany Osier 04/24/2018, 12:09  PM

## 2018-04-25 NOTE — Progress Notes (Signed)
Patient ID: Sarah Woodward, female   DOB: 09/21/1957, 62 y.o.   MRN: [id number] Vital signs are stable and patient is awaiting placement for skilled nursing facility.

## 2018-04-26 NOTE — Progress Notes (Signed)
Patient ID: Sarah Woodward, female   DOB: 09/21/1957, 62 y.o.   MRN: [id number] Vital signs are stable [name] continues to do reasonably well She is awaiting placement in skilled nursing facility for further convalescence

## 2018-04-27 MED ORDER — ALUM & MAG HYDROXIDE-SIMETH 200-200-20 MG/5ML PO SUSP
30.0000 mL | ORAL | Status: DC | PRN
Start: 2018-04-27 — End: 2018-04-28
  Administered 2018-04-27 – 2018-04-28 (×3): 30 mL via ORAL
  Filled 2018-04-27 (×3): qty 30

## 2018-04-27 MED ORDER — BISACODYL 5 MG PO TBEC
5.0000 mg | DELAYED_RELEASE_TABLET | Freq: Every day | ORAL | Status: DC | PRN
  Administered 2018-04-27: 10 mg via ORAL
  Filled 2018-04-27: qty 2

## 2018-04-27 NOTE — Progress Notes (Signed)
Pt report of stomach pain which she refers to as gas in her stomach. Pt belching; abd sounds audible in all quadrants; standing prn Maalox and Biscolax ordered and given; med effective along with prn dilaudid. Pt started to cry again after walking with PT and said abd pain is back again; prn Maalox and dilaudid administered again; pt encouraged to eat and move. Pt said she informed Dr. Jordan LikesPool when she saw him during ambulation with PT. No new orders received. Will continue to closely monitor. Dionne BucyP. Amo Andrei Mccook RN

## 2018-04-27 NOTE — Progress Notes (Signed)
Occupational Therapy Treatment Patient Details Name: Sarah Woodward MRN: [id number] DOB: 09/21/1957 Today's Date: 04/27/2018    History of present illness 62 yo female s/p Bilateral L3-L5 decompressive laminotomies and foraminotomies. PMH including asthma, HTN, Spondylolisthesis of lumbar region, and colon cancer.   OT comments  Pt presents sitting EOB with RN providing meds, agreeable to OT tx session. Session limited as pt with increased pain and nausea levels while sitting EOB. Pt also with one episode of emesis while sitting, therefore deferred mobility attempts due to safety concerns. Pt requiring minA for brace management and modA for bed mobility to return to supine in bed with setup assist provided for simple grooming ADLs; pt made comfortable in bed. Feel POC remains appropriate at this time. Will continue to follow acutely to progress pt towards established OT goals.   Follow Up Recommendations  SNF;Supervision/Assistance - 24 hour    Equipment Recommendations  3 in 1 bedside commode          Precautions / Restrictions Precautions Precautions: Fall;Back Precaution Comments: reviewed precautions with patient and brace wear schedule Required Braces or Orthoses: Spinal Brace Spinal Brace: Lumbar corset;Applied in sitting position Restrictions Weight Bearing Restrictions: No       Mobility Bed Mobility Overal bed mobility: Needs Assistance Bed Mobility: Sit to Sidelying;Rolling Rolling: Min assist       Sit to sidelying: Mod assist General bed mobility comments: assist for LEs onto EOB and to guide LEs while rolling onto back                         Balance Overall balance assessment: Needs assistance Sitting-balance support: Bilateral upper extremity supported;No upper extremity supported;Feet supported Sitting balance-Leahy Scale: Fair                                     ADL either performed or assessed with clinical judgement    ADL Overall ADL's : Needs assistance/impaired     Grooming: Set up;Sitting;Wash/dry face           Upper Body Dressing : Minimal assistance;Sitting Upper Body Dressing Details (indicate cue type and reason): assist for brace management                    General ADL Comments: limited session as pt with significant pain and n/v during session; pt seated EOB at start of session, agreeable to getting up and ambulating however as session progressed pt becoming increasingly more nauseated and with episode of emesis, mobility deferred due to safety concerns, returned to supine in bed and made comfortable in bed                        Cognition Arousal/Alertness: Awake/alert Behavior During Therapy: WFL for tasks assessed/performed Overall Cognitive Status: Within Functional Limits for tasks assessed                                                            Pertinent Vitals/ Pain       Pain Assessment: Faces Faces Pain Scale: Hurts worst Pain Location: Back Pain Descriptors / Indicators: Crying;Guarding;Grimacing;Sore Pain Intervention(s): Limited activity within patient's tolerance;Monitored during session;Repositioned;RN  gave pain meds during session  Home Living                                          Prior Functioning/Environment              Frequency  Min 2X/week        Progress Toward Goals  OT Goals(current goals can now be found in the care plan section)  Progress towards OT goals: OT to reassess next treatment  Acute Rehab OT Goals Patient Stated Goal: none stated OT Goal Formulation: With patient Time For Goal Achievement: 05/06/18 Potential to Achieve Goals: Good  Plan Discharge plan remains appropriate    Co-evaluation                 AM-PAC PT "6 Clicks" Daily Activity     Outcome Measure   Help from another person eating meals?: None Help from another person taking care of  personal grooming?: A Little Help from another person toileting, which includes using toliet, bedpan, or urinal?: A Lot Help from another person bathing (including washing, rinsing, drying)?: A Lot Help from another person to put on and taking off regular upper body clothing?: A Little Help from another person to put on and taking off regular lower body clothing?: A Lot 6 Click Score: 16    End of Session Equipment Utilized During Treatment: Gait belt;Back brace  OT Visit Diagnosis: Unsteadiness on feet (R26.81);Other abnormalities of gait and mobility (R26.89);Muscle weakness (generalized) (M62.81);Pain Pain - part of body: (back )   Activity Tolerance Patient limited by pain;Other (comment)(limited due to n/v )   Patient Left in bed;with call bell/phone within reach;with bed alarm set   Nurse Communication Mobility status;Other (comment)(pt with continued n/v )        Time: 1478-2956: 0826-0847 OT Time Calculation (min): 21 min  Charges: OT General Charges $OT Visit: 1 Visit OT Treatments $Self Care/Home Management : 8-22 mins  Marcy SirenBreanna Jaynee Winters, OT Pager [phone] 04/27/2018    Sarah Woodward 04/27/2018, 9:29 AM

## 2018-04-27 NOTE — Progress Notes (Signed)
Physical Therapy Treatment Patient Details Name: Sarah Woodward MRN: [id number] DOB: 09/21/1957 Today's Date: 04/27/2018    History of Present Illness 62 yo female s/p Bilateral L3-L5 decompressive laminotomies and foraminotomies. PMH including asthma, HTN, Spondylolisthesis of lumbar region, and colon cancer.    PT Comments    Patient reporting abdominal discomfort throughout session - seen by MD in hallway with patient reporting to MD and nursing staff.  Patient ambulating in hallway with min guard for general safety - still with B knees flexed throughout as well as noted steppage and trendelenburg gait patterns. Encouraged patient to increase mobility with nursing staff throughout day.    Follow Up Recommendations  SNF;Supervision/Assistance - 24 hour     Equipment Recommendations  Rolling walker with 5" wheels    Recommendations for Other Services       Precautions / Restrictions Precautions Precautions: Fall;Back Precaution Comments: reveiwed precautions - unable to state all 3 prior to review Required Braces or Orthoses: Spinal Brace Spinal Brace: Lumbar corset;Applied in sitting position Restrictions Weight Bearing Restrictions: No    Mobility  Bed Mobility Overal bed mobility: Needs Assistance Bed Mobility: Rolling;Sidelying to Sit;Sit to Sidelying Rolling: Min guard Sidelying to sit: Min assist;Min guard     Sit to sidelying: Mod assist General bed mobility comments: A for LE management to/from bed; verbal cueing thorughout for safety and precautions  Transfers Overall transfer level: Needs assistance Equipment used: Rolling walker (2 wheeled) Transfers: Sit to/from Stand Sit to Stand: Min assist         General transfer comment: Min A to pwoer up from bed and toilet; reduced knee extension throughout  Ambulation/Gait Ambulation/Gait assistance: Min guard Gait Distance (Feet): (hallway ambulation) Assistive device: Rolling walker (2 wheeled) Gait  Pattern/deviations: Step-to pattern;Step-through pattern;Decreased stride length;Steppage;Trendelenburg;Wide base of support Gait velocity: Decreased   General Gait Details: at times steppage and other times trendelenburg; very slow and cautious with movement; reports abdominal pain; flexed knees throughout   Stairs             Wheelchair Mobility    Modified Rankin (Stroke Patients Only)       Balance Overall balance assessment: Needs assistance Sitting-balance support: No upper extremity supported;Feet supported Sitting balance-Leahy Scale: Fair     Standing balance support: During functional activity;Bilateral upper extremity supported Standing balance-Leahy Scale: Poor Standing balance comment: reliant on RW                            Cognition Arousal/Alertness: Awake/alert Behavior During Therapy: WFL for tasks assessed/performed Overall Cognitive Status: Within Functional Limits for tasks assessed                                        Exercises      General Comments        Pertinent Vitals/Pain Pain Assessment: Faces Faces Pain Scale: Hurts whole lot Pain Location: stomach Pain Descriptors / Indicators: Crying;Guarding;Grimacing Pain Intervention(s): Limited activity within patient's tolerance;Monitored during session;Repositioned    Home Living                      Prior Function            PT Goals (current goals can now be found in the care plan section) Acute Rehab PT Goals Patient Stated Goal: none stated PT Goal Formulation:  With patient Time For Goal Achievement: 05/06/18 Potential to Achieve Goals: Good Progress towards PT goals: Progressing toward goals    Frequency    Min 5X/week      PT Plan Current plan remains appropriate    Co-evaluation              AM-PAC PT "6 Clicks" Daily Activity  Outcome Measure  Difficulty turning over in bed (including adjusting bedclothes, sheets  and blankets)?: Unable Difficulty moving from lying on back to sitting on the side of the bed? : Unable Difficulty sitting down on and standing up from a chair with arms (e.g., wheelchair, bedside commode, etc,.)?: Unable Help needed moving to and from a bed to chair (including a wheelchair)?: A Little Help needed walking in hospital room?: A Little Help needed climbing 3-5 steps with a railing? : A Lot 6 Click Score: 11    End of Session Equipment Utilized During Treatment: Gait belt;Back brace Activity Tolerance: Patient tolerated treatment well Patient left: in bed;with call bell/phone within reach Nurse Communication: Mobility status PT Visit Diagnosis: Unsteadiness on feet (R26.81);Pain;Difficulty in walking, not elsewhere classified (R26.2) Pain - part of body: (back)     Time: [id number] PT Time Calculation (min) (ACUTE ONLY): 30 min  Charges:  $Gait Training: 8-22 mins $Therapeutic Activity: 8-22 mins                     Kipp LaurenceStephanie R Alexes Lamarque, PT, DPT 04/27/18 1:07 PM Pager: [phone]

## 2018-04-28 NOTE — Clinical Social Work Placement (Signed)
Nurse to call report to [phone], Room 146     CLINICAL SOCIAL WORK PLACEMENT  NOTE  Date:  04/28/2018  Patient Details  Name: Sarah Woodward MRN: [id number] Date of Birth: 09/21/1957  Clinical Social Work is seeking post-discharge placement for this patient at the Skilled  Nursing Facility level of care (*CSW will initial, date and re-position this form in  chart as items are completed):  Yes   Patient/family provided with [facility] Clinical Social Work Department's list of facilities offering this level of care within the geographic area requested by the patient (or if unable, by the patient's family).  Yes   Patient/family informed of their freedom to choose among providers that offer the needed level of care, that participate in Medicare, Medicaid or managed care program needed by the patient, have an available bed and are willing to accept the patient.  Yes   Patient/family informed of [facility]'s ownership interest in [facility] and [facility], as well as of the fact that they are under no obligation to receive care at these facilities.  PASRR submitted to EDS on       PASRR number received on       Existing PASRR number confirmed on       FL2 transmitted to all facilities in geographic area requested by pt/family on       FL2 transmitted to all facilities within larger geographic area on       Patient informed that his/her managed care company has contracts with or will negotiate with certain facilities, including the following:        Yes   Patient/family informed of bed offers received.  Patient chooses bed at ([facility])     Physician recommends and patient chooses bed at      Patient to be transferred to ([facility]) on 04/28/18.  Patient to be transferred to facility by PTAR     Patient family notified on 04/28/18 of transfer.  Name of family member notified:        PHYSICIAN       Additional Comment:     _______________________________________________ Baldemar LenisElizabeth M Ellean Firman, LCSW 04/28/2018, 11:04 AM

## 2018-04-28 NOTE — Progress Notes (Signed)
Overall stable.  No new issues or problems.  Transient gas pains yesterday now resolved.  Patient mobilizing with therapy.  Very limited home support.  Still awaiting discharge to skilled nursing facility.  Awake and alert.  Oriented and appropriate.  Motor and sensory function stable.  Abdomen soft.  Progressing well following 2 level lumbar decompression and fusion surgery.  Awaiting discharge when bed and insurance availability allows.

## 2018-04-28 NOTE — Care Management Note (Signed)
Case Management Note  Patient Details  Name: Sarah Woodward MRN: [id number] Date of Birth: 09/21/1957  Subjective/Objective:                    Action/Plan: Pt discharging to [facility]. CM signing off.   Expected Discharge Date:  04/27/18               Expected Discharge Plan:  Skilled Nursing Facility  In-House Referral:  Clinical Social Work  Discharge planning Services     Post Acute Care Choice:    Choice offered to:     DME Arranged:    DME Agency:     HH Arranged:    HH Agency:     Status of Service:  Completed, signed off  If discussed at [employer] Meetings, dates discussed:    Additional Comments:  Kermit BaloKelli F Dineen Conradt, RN 04/28/2018, 10:56 AM

## 2018-04-28 NOTE — Progress Notes (Signed)
Pt discharge education and instructions completed. Pt discharge to [facility] and report called to nurse Shelly at the facility. Pt IV removed and back incision dsg remains unremarkable. Pt picked by [facility] to be transported off to disposition. Dionne BucyP. Amo Jessalyn Hinojosa RN

## 2018-04-28 NOTE — Progress Notes (Signed)
CSW following for discharge plan. Patient has chosen [facility], and facility initiated insurance authorization through [employer] on Friday. CSW received report from Admissions at [facility] that they still have not received word [facility] that [facility] has been authorized to admit to SNF; they spent an hour and a half on hold with [employer] just to confirm that they had received the clinical information and were still working on it.   CSW to continue to follow.  Blenda NicelyElizabeth Gilberte Gorley, [place] Clinical Social Worker [phone]

## 2018-07-13 IMAGING — MG DIGITAL SCREENING BILATERAL MAMMOGRAM WITH CAD
5 series · 5 of 5 positions shown · non-contrast
Comparison: Previous exam(s).

CLINICAL DATA: Screening.

EXAM:
DIGITAL SCREENING BILATERAL MAMMOGRAM WITH CAD

[L MLO (1 of 2)]
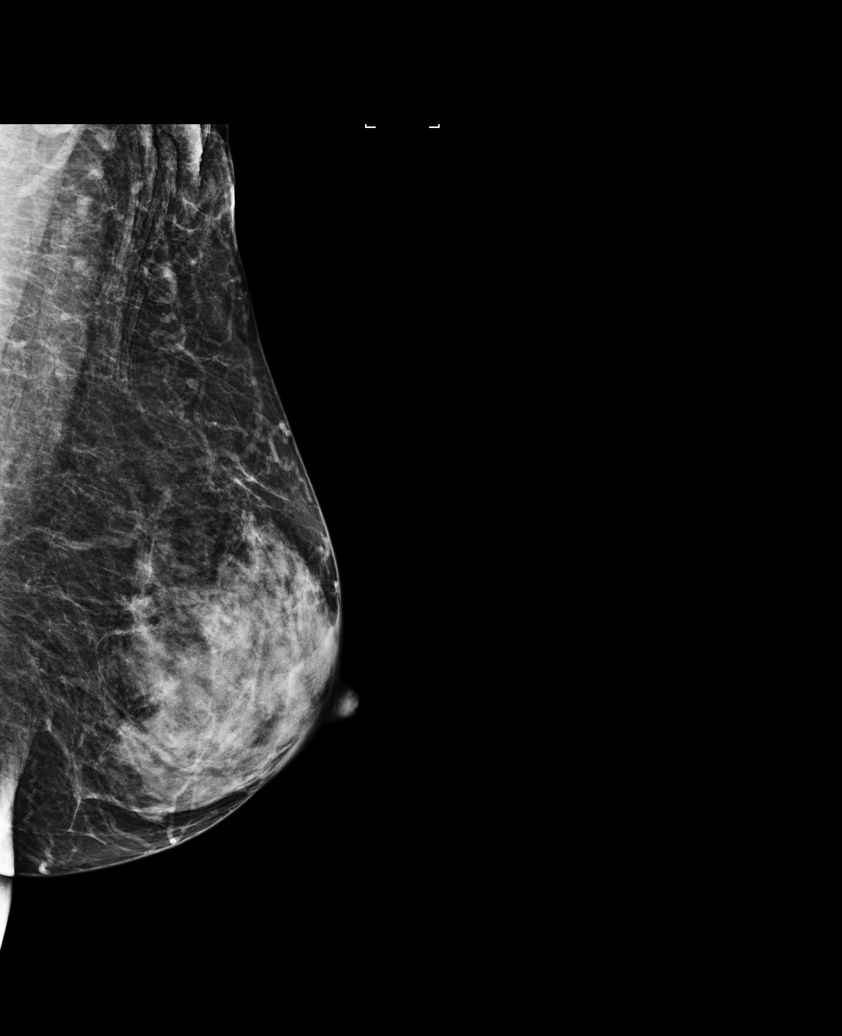

[L CC]
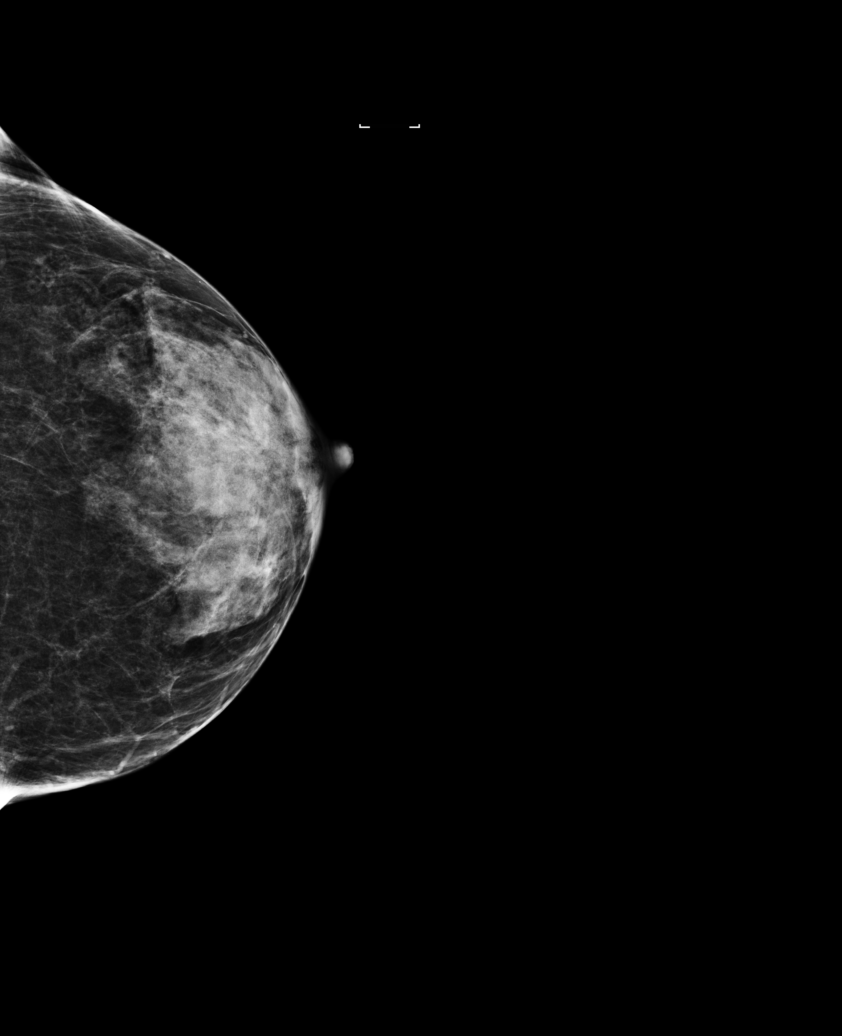

[L MLO (2 of 2)]
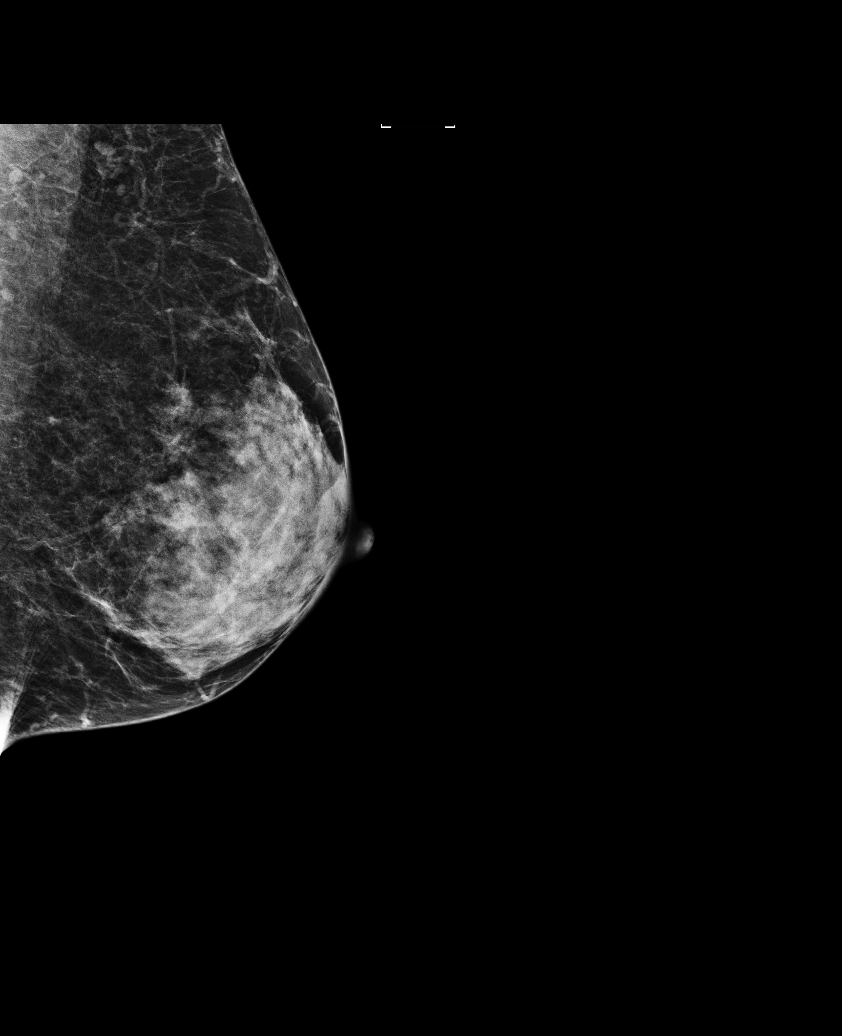

[R CC]
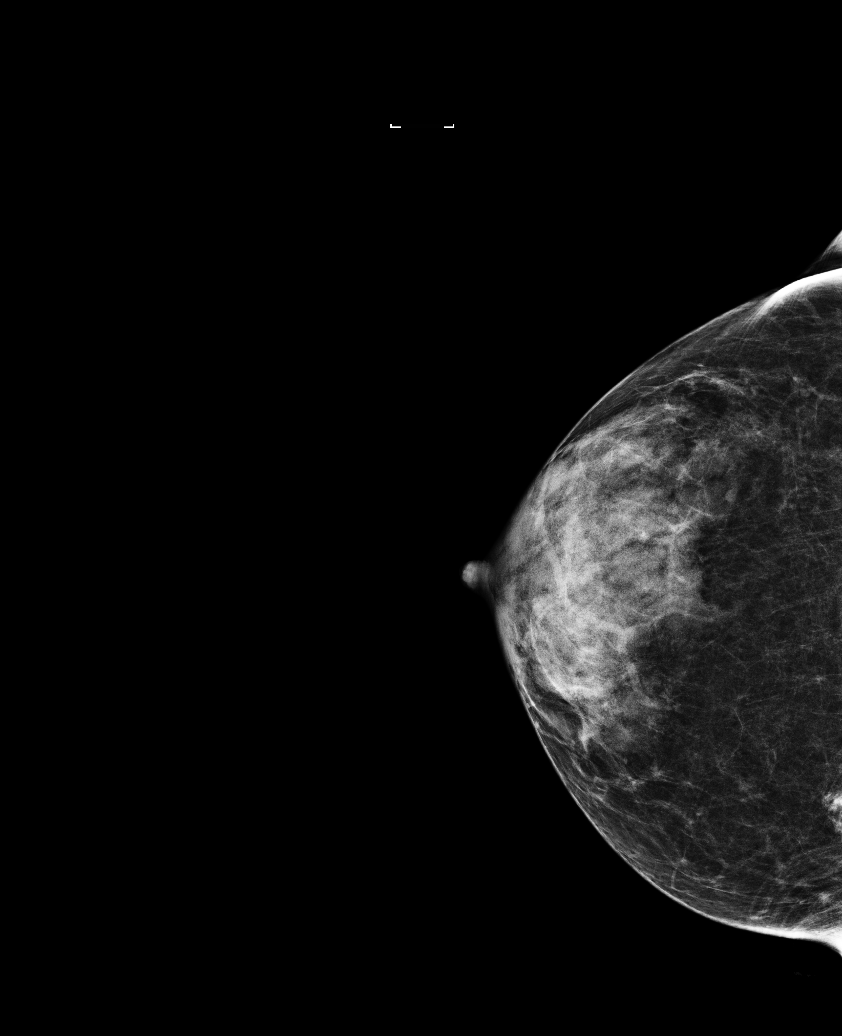

[R MLO]
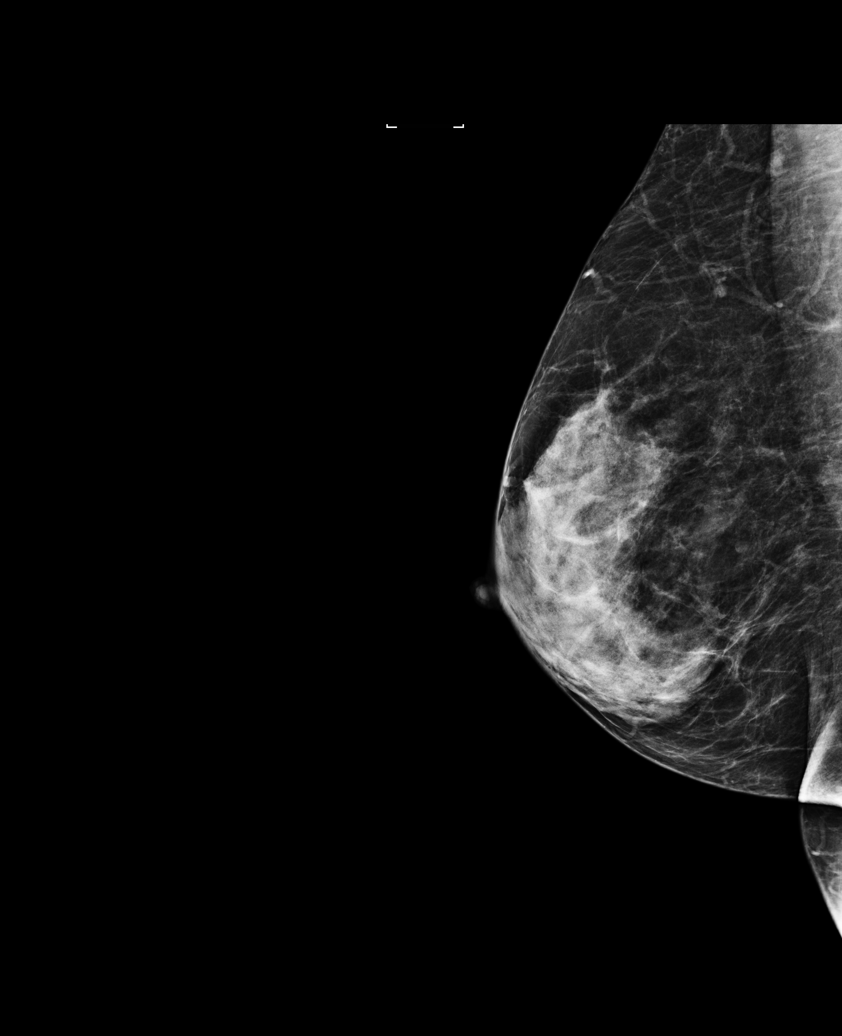

[5 of 5 positions shown; findings below may reference images not displayed]

ACR Breast Density Category b: There are scattered areas of
fibroglandular density.
FINDINGS: In the right breast, a possible asymmetry warrants further
evaluation. In the left breast, no findings suspicious for
malignancy. Images were processed with CAD.
IMPRESSION: Further evaluation is suggested for possible asymmetry in the right
breast.

RECOMMENDATION:
Diagnostic mammogram and possibly ultrasound of the right breast.
(Code:[id number])

The patient will be contacted regarding the findings, and additional
imaging will be scheduled.

BI-RADS CATEGORY  0: Incomplete. Need additional imaging evaluation
and/or prior mammograms for comparison.

## 2018-07-29 ENCOUNTER — Ambulatory Visit (INDEPENDENT_AMBULATORY_CARE_PROVIDER_SITE_OTHER): Payer: BLUE CROSS/BLUE SHIELD | Admitting: Urology

## 2018-07-29 DIAGNOSIS — N1339 Other hydronephrosis: Secondary | ICD-10-CM

## 2018-07-29 DIAGNOSIS — R1084 Generalized abdominal pain: Secondary | ICD-10-CM | POA: Diagnosis not present

## 2018-08-11 ENCOUNTER — Encounter: Payer: BLUE CROSS/BLUE SHIELD | Admitting: Adult Health

## 2018-09-01 ENCOUNTER — Encounter: Payer: Self-pay | Admitting: Adult Health

## 2018-09-01 ENCOUNTER — Other Ambulatory Visit (HOSPITAL_COMMUNITY)
Admission: RE | Admit: 2018-09-01 | Discharge: 2018-09-01 | Disposition: A | Discharge status: Home / Self Care | Payer: BLUE CROSS/BLUE SHIELD | Source: Ambulatory Visit | Attending: Adult Health | Admitting: Adult Health

## 2018-09-01 ENCOUNTER — Other Ambulatory Visit: Payer: Self-pay

## 2018-09-01 ENCOUNTER — Ambulatory Visit (INDEPENDENT_AMBULATORY_CARE_PROVIDER_SITE_OTHER): Payer: BLUE CROSS/BLUE SHIELD | Admitting: Adult Health

## 2018-09-01 VITALS — BP 172/97 | HR 87 | Ht 67.0 in | Wt 196.0 lb

## 2018-09-01 DIAGNOSIS — Z01419 Encounter for gynecological examination (general) (routine) without abnormal findings: Secondary | ICD-10-CM

## 2018-09-01 DIAGNOSIS — Z1212 Encounter for screening for malignant neoplasm of rectum: Secondary | ICD-10-CM

## 2018-09-01 DIAGNOSIS — Z1211 Encounter for screening for malignant neoplasm of colon: Secondary | ICD-10-CM

## 2018-09-01 LAB — HEMOCCULT GUIAC POC 1CARD (OFFICE): Fecal Occult Blood, POC: NEGATIVE

## 2018-09-01 NOTE — Progress Notes (Signed)
Patient ID: Sarah Woodward, female   DOB: 09/21/1957, 62 y.o.   MRN: [id number] History of Present Illness: Sarah MayLouvenia is a 62 year old black female, widowed, PM in for pap and pelvic exam. She had physical and labs with PCP. PCP is Dr Selena BattenKim.    Current Medications, Allergies, Past Medical History, Past Surgical History, Family History and Social History were reviewed in [facility] electronic medical record.     Review of Systems: Patient denies any headaches, hearing loss, fatigue, blurred vision, shortness of breath, chest pain, abdominal pain, problems with bowel movements, urination, or intercourse(not having sex,has been over 10 years). No joint pain or mood swings. She has had back surgery and is walking with a cane and has brace on right lower leg.   Physical Exam:BP (!) 172/97 (BP Location: Right Arm, Patient Position: Sitting, Cuff Size: Normal)   Pulse 87   Ht 5\' 7"  (1.702 m)   Wt 196 lb (88.9 kg)   BMI 30.70 kg/m  General:  Well developed, well nourished, no acute distress Skin:  Warm and dry Neck:  Midline trachea, normal thyroid, good ROM, no lymphadenopathy,no carotid bruits heard Lungs; Clear to auscultation bilaterally Cardiovascular: Regular rate and rhythm Pelvic:  External genitalia is normal in appearance, no lesions.  The vagina is normal in appearance. Urethra has no lesions or masses. The cervix is smooth, pap with HPV performed.  Uterus is felt to be normal size, shape, and contour.  No adnexal masses or tenderness noted.Bladder is non tender, no masses felt. Rectal: Good sphincter tone, no polyps, or hemorrhoids felt.  Hemoccult negative. Psych:  No mood changes, alert and cooperative,seems happy PHQ 2 score 0, Fall risk is low Examination chaperoned by Francene FindersKim lancaster RN.  Impression:  1. Encounter for gynecological examination with Papanicolaou smear of cervix   2. Screening for colorectal cancer      Plan: Pap in 3 years if normal Physical and  labs with PCP Mammogram yearly Colonoscopy per GI

## 2018-09-04 LAB — CYTOLOGY - PAP
DIAGNOSIS: NEGATIVE
HPV: NOT DETECTED

## 2019-05-16 IMAGING — RF DG C-ARM 61-120 MIN
1 series · 3 of 3 positions shown · non-contrast
Comparison: MRI lumbar spine 07/23/2017

FLUOROSCOPY TIME:  1 minute 2 seconds

CLINICAL DATA: PLIF L3-L5

EXAM:
LUMBAR SPINE - 2-3 VIEW; DG C-ARM 61-120 MIN

[Series 1: run · 3 of 3 slices shown]
[im 1/3]
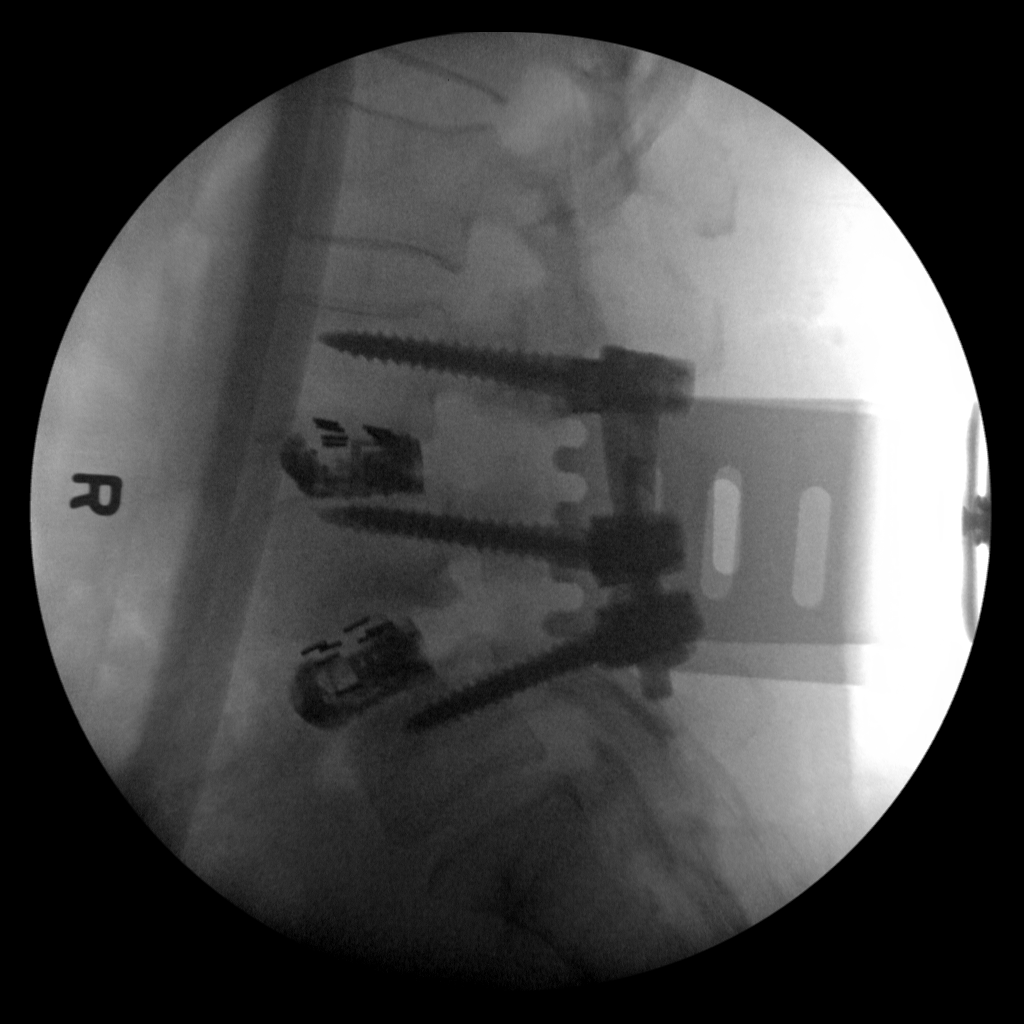
[im 2/3]
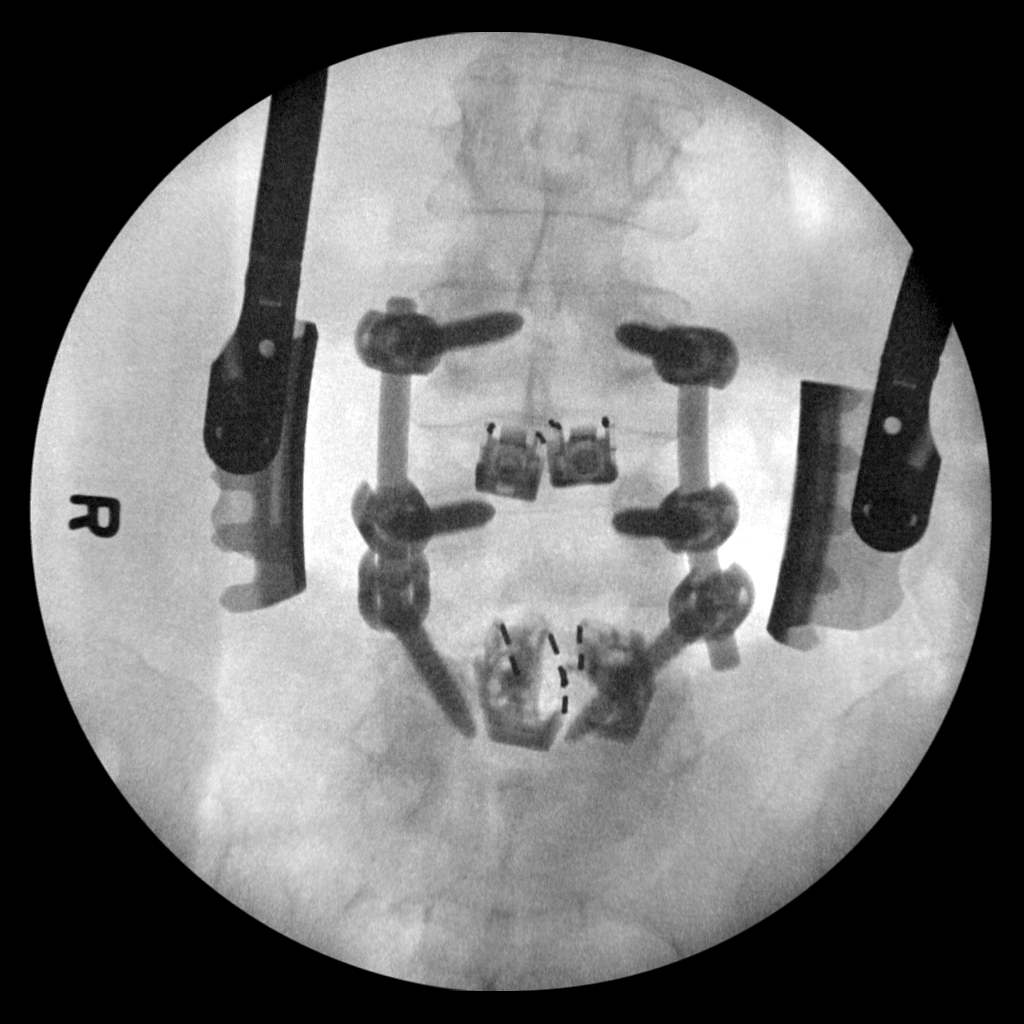
[im 3/3]
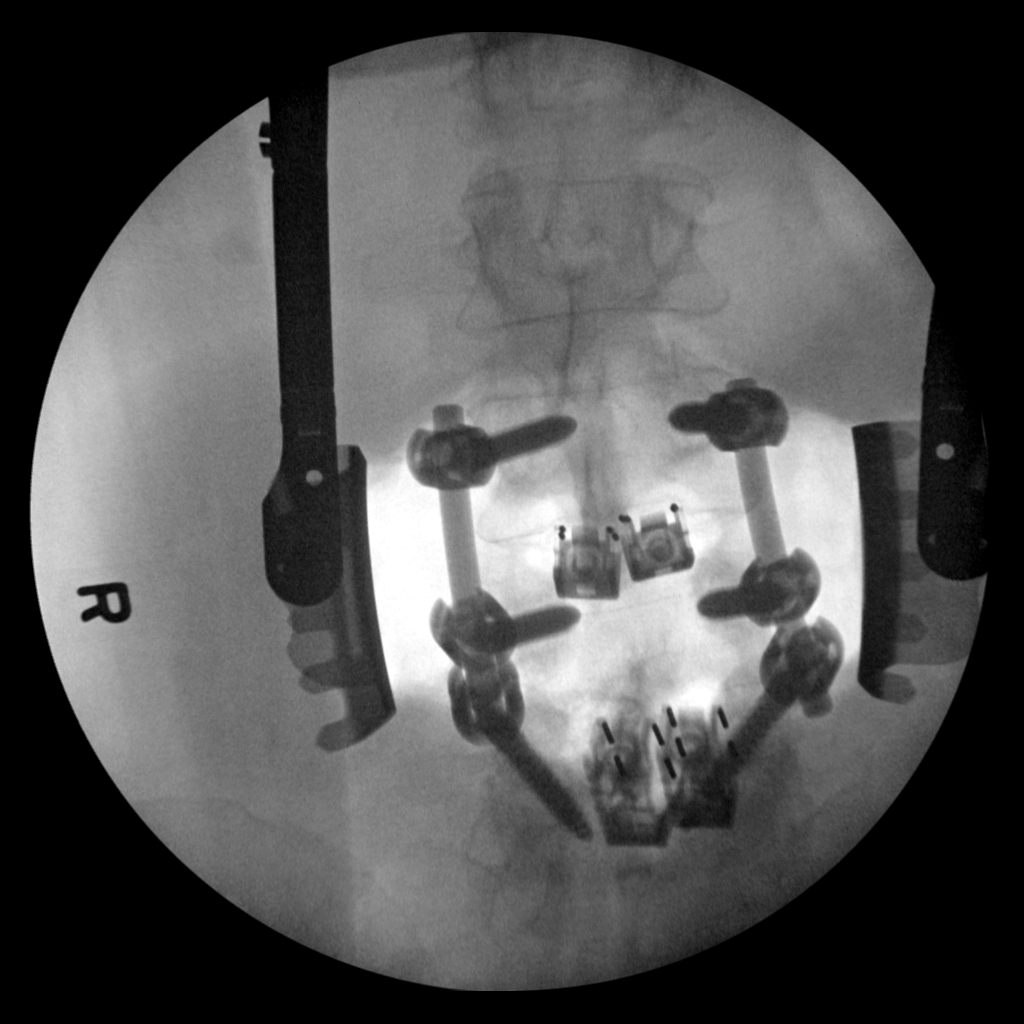

[3 of 3 positions shown; findings below may reference images not displayed]

FINDINGS: Prior MRI labeled with 5 lumbar vertebra.

BILATERAL pedicle screws and posterior bars are seen at L3-L5.

Disc prostheses at L3-L4 and L4-L5 disc spaces.

Significantly decreased anterolisthesis at L4-L5.

Bones appear demineralized.
IMPRESSION: Posterior fusion L3-L5.

## 2019-05-18 IMAGING — DX DG CHEST 1V PORT
1 series · 1 of 1 positions shown · non-contrast
Comparison: Remote chest radiograph 11/02/2007

CLINICAL DATA: Fever tonight.

EXAM:
PORTABLE CHEST 1 VIEW

[chest]
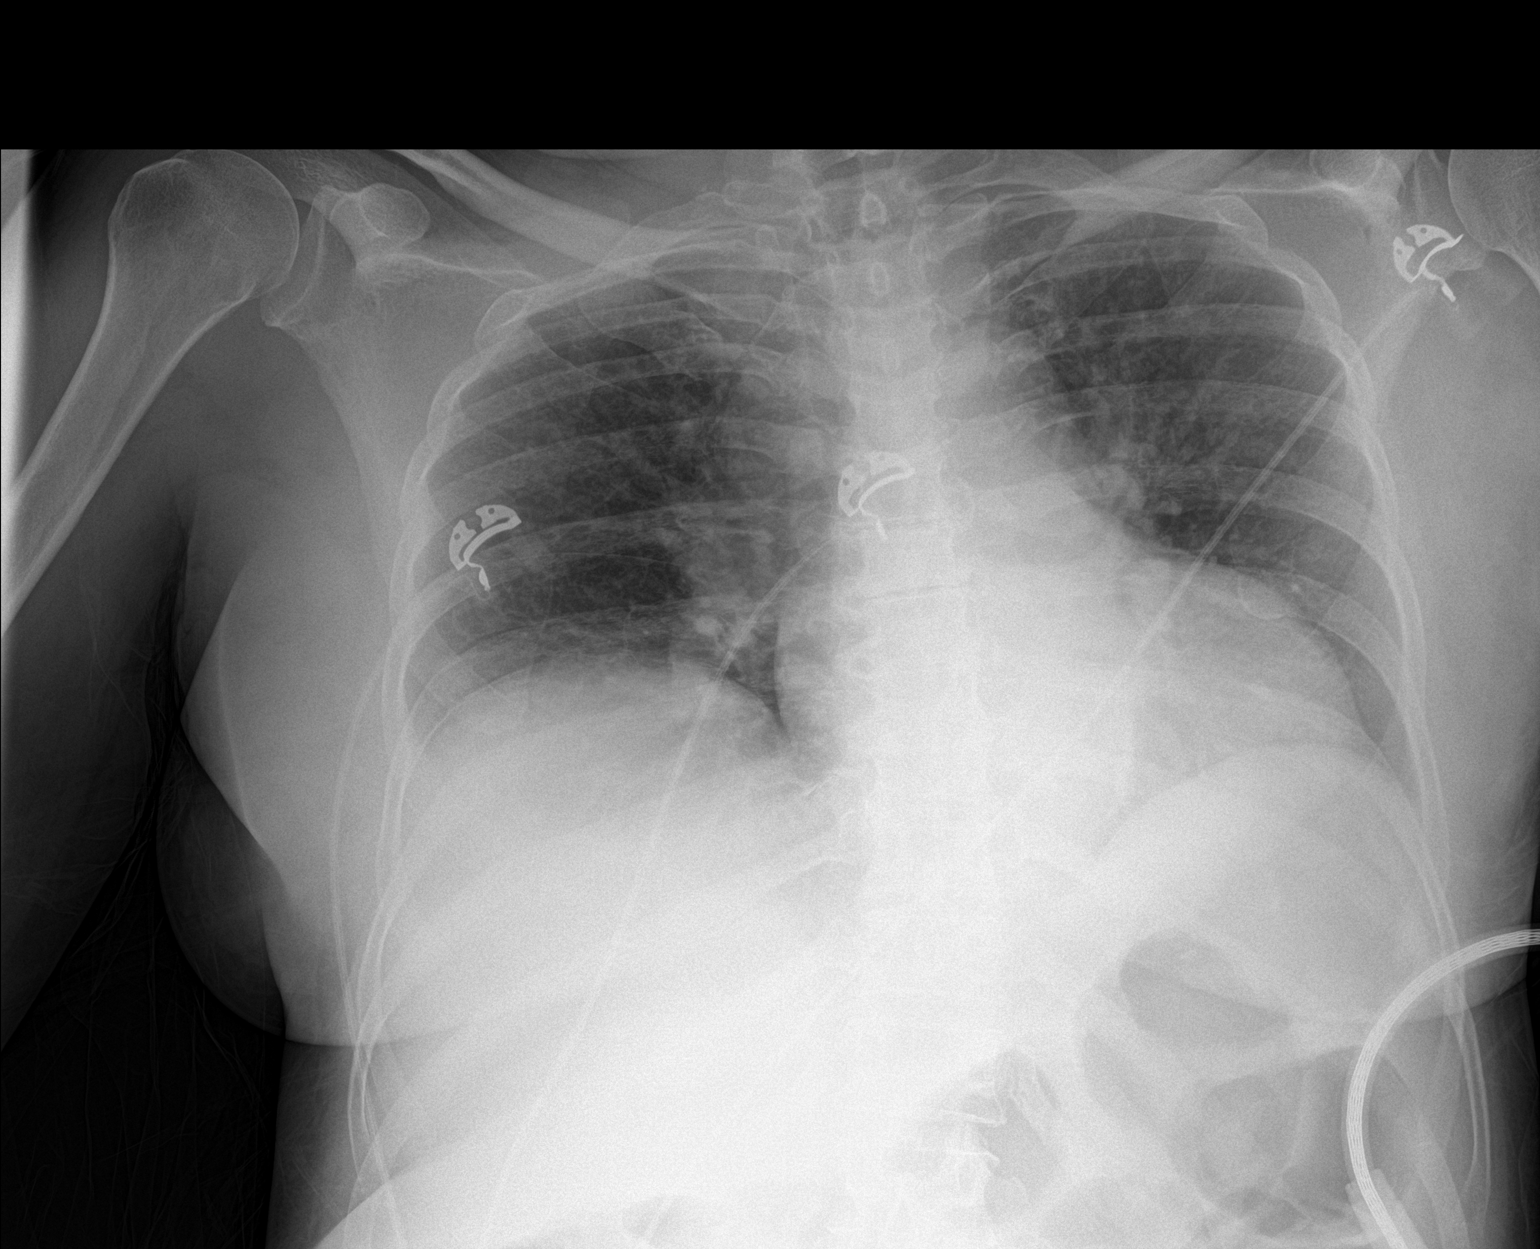

[1 of 1 positions shown; findings below may reference images not displayed]

FINDINGS: Low lung volumes bronchovascular crowding. Mild bibasilar
atelectasis. Normal heart size and mediastinal contours for
technique. No large pleural effusion. No pneumothorax.
IMPRESSION: Low lung volumes with bronchovascular crowding and bibasilar
atelectasis.

## 2021-12-13 ENCOUNTER — Other Ambulatory Visit (HOSPITAL_COMMUNITY): Payer: Self-pay | Admitting: Nurse Practitioner

## 2021-12-13 DIAGNOSIS — Z1231 Encounter for screening mammogram for malignant neoplasm of breast: Secondary | ICD-10-CM

## 2021-12-19 ENCOUNTER — Ambulatory Visit (HOSPITAL_COMMUNITY): Payer: Self-pay

## 2021-12-19 ENCOUNTER — Ambulatory Visit (HOSPITAL_COMMUNITY)
Admission: RE | Admit: 2021-12-19 | Discharge: 2021-12-19 | Disposition: A | Discharge status: Home / Self Care | Payer: Medicare Other | Source: Ambulatory Visit | Attending: Nurse Practitioner | Admitting: Nurse Practitioner

## 2021-12-19 ENCOUNTER — Other Ambulatory Visit: Payer: Self-pay

## 2021-12-19 ENCOUNTER — Other Ambulatory Visit (HOSPITAL_COMMUNITY): Payer: Self-pay | Admitting: Nurse Practitioner

## 2021-12-19 DIAGNOSIS — Z1231 Encounter for screening mammogram for malignant neoplasm of breast: Secondary | ICD-10-CM

## 2021-12-21 ENCOUNTER — Encounter: Payer: Self-pay | Admitting: Adult Health

## 2022-12-12 ENCOUNTER — Other Ambulatory Visit (HOSPITAL_COMMUNITY): Payer: Self-pay | Admitting: Adult Health

## 2022-12-12 DIAGNOSIS — Z1231 Encounter for screening mammogram for malignant neoplasm of breast: Secondary | ICD-10-CM

## 2022-12-12 DIAGNOSIS — Z13820 Encounter for screening for osteoporosis: Secondary | ICD-10-CM

## 2022-12-25 ENCOUNTER — Ambulatory Visit (HOSPITAL_COMMUNITY)
Admission: RE | Admit: 2022-12-25 | Discharge: 2022-12-25 | Disposition: A | Discharge status: Home / Self Care | Payer: Medicare Other | Source: Ambulatory Visit | Attending: Adult Health | Admitting: Adult Health

## 2022-12-25 ENCOUNTER — Ambulatory Visit (HOSPITAL_COMMUNITY): Payer: Medicare Other

## 2022-12-25 DIAGNOSIS — M85852 Other specified disorders of bone density and structure, left thigh: Secondary | ICD-10-CM | POA: Diagnosis not present

## 2022-12-25 DIAGNOSIS — Z1231 Encounter for screening mammogram for malignant neoplasm of breast: Secondary | ICD-10-CM

## 2022-12-25 DIAGNOSIS — Z780 Asymptomatic menopausal state: Secondary | ICD-10-CM | POA: Insufficient documentation

## 2022-12-25 DIAGNOSIS — Z13820 Encounter for screening for osteoporosis: Secondary | ICD-10-CM

## 2024-01-12 ENCOUNTER — Encounter (HOSPITAL_COMMUNITY): Payer: Self-pay | Admitting: Adult Health

## 2024-01-12 ENCOUNTER — Other Ambulatory Visit (HOSPITAL_COMMUNITY): Payer: Self-pay | Admitting: Adult Health

## 2024-01-12 DIAGNOSIS — Z1231 Encounter for screening mammogram for malignant neoplasm of breast: Secondary | ICD-10-CM

## 2024-06-09 ENCOUNTER — Encounter (HOSPITAL_COMMUNITY): Payer: Self-pay | Admitting: Emergency Medicine

## 2024-06-09 ENCOUNTER — Emergency Department (HOSPITAL_COMMUNITY)

## 2024-06-09 ENCOUNTER — Other Ambulatory Visit: Payer: Self-pay

## 2024-06-09 ENCOUNTER — Emergency Department (HOSPITAL_COMMUNITY): Admission: EM | Admit: 2024-06-09 | Discharge: 2024-06-09 | Disposition: A | Discharge status: Home / Self Care

## 2024-06-09 DIAGNOSIS — R55 Syncope and collapse: Secondary | ICD-10-CM | POA: Diagnosis present

## 2024-06-09 DIAGNOSIS — Z79899 Other long term (current) drug therapy: Secondary | ICD-10-CM | POA: Insufficient documentation

## 2024-06-09 DIAGNOSIS — Z7982 Long term (current) use of aspirin: Secondary | ICD-10-CM | POA: Diagnosis not present

## 2024-06-09 HISTORY — DX: Polyneuropathy, unspecified: G62.9

## 2024-06-09 LAB — TROPONIN I (HIGH SENSITIVITY)
Troponin I (High Sensitivity): 13 ng/L (ref <18)
Troponin I (High Sensitivity): 11 ng/L (ref <18)

## 2024-06-09 LAB — BASIC METABOLIC PANEL WITH GFR
Anion gap: 13 (ref 5–15)
BUN: 26 mg/dL — ABNORMAL HIGH (ref 8–23)
CO2: 24 mmol/L (ref 22–32)
Calcium: 9.4 mg/dL (ref 8.9–10.3)
Chloride: 99 mmol/L (ref 98–111)
Creatinine, Ser: 1.87 mg/dL — ABNORMAL HIGH (ref 0.44–1.00)
GFR, Estimated: 29 mL/min — ABNORMAL LOW (ref >60)
Glucose, Bld: 144 mg/dL — ABNORMAL HIGH (ref 70–99)
Potassium: 3.5 mmol/L (ref 3.5–5.1)
Sodium: 136 mmol/L (ref 135–145)

## 2024-06-09 LAB — URINALYSIS, ROUTINE W REFLEX MICROSCOPIC
Bilirubin Urine: NEGATIVE
Glucose, UA: NEGATIVE mg/dL
Hgb urine dipstick: NEGATIVE
Ketones, ur: NEGATIVE mg/dL
Leukocytes,Ua: NEGATIVE
Nitrite: NEGATIVE
Protein, ur: NEGATIVE mg/dL
Specific Gravity, Urine: 1.01 (ref 1.005–1.030)
pH: 5 (ref 5.0–8.0)

## 2024-06-09 LAB — CBC WITH DIFFERENTIAL/PLATELET
Abs Immature Granulocytes: 0.03 K/uL (ref 0.00–0.07)
Basophils Absolute: 0 K/uL (ref 0.0–0.1)
Basophils Relative: 1 %
Eosinophils Absolute: 0.2 K/uL (ref 0.0–0.5)
Eosinophils Relative: 3 %
HCT: 40.6 % (ref 36.0–46.0)
Hemoglobin: 13.2 g/dL (ref 12.0–15.0)
Immature Granulocytes: 1 %
Lymphocytes Relative: 17 %
Lymphs Abs: 1.2 K/uL (ref 0.7–4.0)
MCH: 29.8 pg (ref 26.0–34.0)
MCHC: 32.5 g/dL (ref 30.0–36.0)
MCV: 91.6 fL (ref 80.0–100.0)
Monocytes Absolute: 0.5 K/uL (ref 0.1–1.0)
Monocytes Relative: 8 %
Neutro Abs: 4.7 K/uL (ref 1.7–7.7)
Neutrophils Relative %: 70 %
Platelets: 258 K/uL (ref 150–400)
RBC: 4.43 MIL/uL (ref 3.87–5.11)
RDW: 13.2 % (ref 11.5–15.5)
WBC: 6.6 K/uL (ref 4.0–10.5)
nRBC: 0 % (ref 0.0–0.2)

## 2024-06-09 NOTE — ED Triage Notes (Signed)
 Per EMS called out for unresponsive, EMS states upon arrival pt only responsive to painful stimuli with a BP of 60/30, RR 22, O2 98%, HR 59; CBG 111; pupils equal and reactive, pt given 400ml NS en route, BP now 119/78; pt reports she last remembers going to bed last night

## 2024-06-09 NOTE — ED Notes (Signed)
 Pt ambulated from bed to restroom, denies dizziness. Pt incontinent of stool, pt cleaned up, belongings placed in bag. Urine sample obtained. Pt family at the bedside.

## 2024-06-09 NOTE — ED Notes (Signed)
 ED Provider at bedside, talking with patient and family.

## 2024-06-09 NOTE — Discharge Instructions (Addendum)
 Stay on your medications as prescribed.  Call and follow-up with your primary care doctor in 1 week.  Return to the ER for new or worsening symptoms.

## 2024-06-09 NOTE — ED Provider Notes (Signed)
 [place] EMERGENCY DEPARTMENT AT [facility] Provider Note   CSN: [id number] Arrival date & time: 06/09/24  1800     Patient presents with: Hypotension   Sarah Woodward is a 68 y.o. female.   68 year old female presents for evaluation of unresponsive episodes.  She states she is unresponsive on her bed.  She remembers laying down on her bed but does not remember anything else.  States she is feeling improved now.  States she has not been feeling ill or weak.  She denies any other symptoms or concerns.        Prior to Admission medications   Medication Sig Start Date End Date Taking? Authorizing Provider  acetaminophen  (TYLENOL ) 500 MG tablet Take 1,000 mg by mouth daily as needed for mild pain.    [provider]  aspirin 81 MG tablet Take 81 mg by mouth daily.     [provider]  atorvastatin  (LIPITOR) 10 MG tablet Take 10 mg by mouth daily.    [provider]  Biotin  5000 MCG TABS Take 5,000 mcg by mouth daily.    [provider]  Cholecalciferol  (VITAMIN D3) 400 units CAPS Take 1 capsule by mouth daily.    [provider]  diazepam  (VALIUM ) 5 MG tablet Take 1-2 tablets (5-10 mg total) by mouth every 6 (six) hours as needed for muscle spasms. 04/24/18   Louis Shove, MD  DULoxetine  (CYMBALTA ) 30 MG capsule Take 30 mg by mouth 2 (two) times daily.  01/21/18   [provider]  ferrous sulfate  (IRON SUPPLEMENT) 325 (65 FE) MG tablet Take 325 mg by mouth daily with breakfast.    [provider]  furosemide  (LASIX ) 40 MG tablet Take 40 mg by mouth daily. 11/26/16   [provider]  gabapentin  (NEURONTIN ) 600 MG tablet Take 600 mg by mouth 3 (three) times daily.  01/12/18   [provider]  Garlic  1000 MG CAPS Take 1,000 mg by mouth daily.    [provider]  HYDROcodone -acetaminophen  (NORCO) 10-325 MG tablet Take 1-2 tablets by mouth every 4 (four) hours as needed for moderate pain ((score  4 to 6)). 04/24/18   Louis Shove, MD  lisinopril  (PRINIVIL ,ZESTRIL ) 40 MG tablet Take 40 mg by mouth daily.    [provider]  MEGARED OMEGA-3 KRILL OIL 500 MG CAPS Take 1 capsule by mouth daily.    [provider]  traMADol  (ULTRAM ) 50 MG tablet Take 50 mg by mouth 3 (three) times daily as needed for moderate pain.  01/13/18   [provider]  VITAMIN A PO Take 1 tablet by mouth daily. 2400 MG    [provider]  vitamin B-12 (CYANOCOBALAMIN ) 1000 MCG tablet Take 1,000 mcg by mouth daily.    [provider]  vitamin C  (ASCORBIC ACID ) 500 MG tablet Take 500 mg by mouth daily.    [provider]  vitamin E  400 UNIT capsule Take 400 Units by mouth daily.    [provider]    Allergies: Codeine    Review of Systems  Constitutional:  Negative for chills and fever.  HENT:  Negative for ear pain and sore throat.   Eyes:  Negative for pain and visual disturbance.  Respiratory:  Negative for cough and shortness of breath.   Cardiovascular:  Negative for chest pain and palpitations.  Gastrointestinal:  Negative for abdominal pain and vomiting.  Genitourinary:  Negative for dysuria and hematuria.  Musculoskeletal:  Negative for arthralgias and  back pain.  Skin:  Negative for color change and rash.  Neurological:  Negative for seizures and syncope.  All other systems reviewed and are negative.   Updated Vital Signs BP 110/78 (BP Location: Right Arm)   Pulse 68   Temp 98.3 F (36.8 C) (Oral)   Resp 18   Ht 5' 7 (1.702 m)   Wt 88.9 kg   SpO2 98%   BMI 30.70 kg/m   Physical Exam Vitals and nursing note reviewed.  Constitutional:      General: She is not in acute distress.    Appearance: She is well-developed.  HENT:     Head: Normocephalic and atraumatic.  Eyes:     Conjunctiva/sclera: Conjunctivae normal.  Cardiovascular:     Rate and Rhythm: Normal rate and regular rhythm.     Heart sounds: No murmur  heard. Pulmonary:     Effort: Pulmonary effort is normal. No respiratory distress.     Breath sounds: Normal breath sounds.  Abdominal:     Palpations: Abdomen is soft.     Tenderness: There is no abdominal tenderness.  Musculoskeletal:        General: No swelling.     Cervical back: Neck supple.  Skin:    General: Skin is warm and dry.     Capillary Refill: Capillary refill takes less than 2 seconds.  Neurological:     General: No focal deficit present.     Mental Status: She is alert.  Psychiatric:        Mood and Affect: Mood normal.     (all labs ordered are listed, but only abnormal results are displayed) Labs Reviewed  BASIC METABOLIC PANEL WITH GFR - Abnormal; Notable for the following components:      Result Value   Glucose, Bld 144 (*)    BUN 26 (*)    Creatinine, Ser 1.87 (*)    GFR, Estimated 29 (*)    All other components within normal limits  CBC WITH DIFFERENTIAL/PLATELET  URINALYSIS, ROUTINE W REFLEX MICROSCOPIC  TROPONIN I (HIGH SENSITIVITY)  TROPONIN I (HIGH SENSITIVITY)    EKG: EKG Interpretation Date/Time:  Wednesday June 09 2024 18:38:02 EDT Ventricular Rate:  70 PR Interval:  148 QRS Duration:  88 QT Interval:  420 QTC Calculation: 453 R Axis:   -50  Text Interpretation: Normal sinus rhythm Left anterior fascicular block Nonspecific T wave abnormality Confirmed by Gennaro Bouchard ([id number]) on 06/09/2024 6:44:19 PM  Radiology: [facility] Chest 1 View Result Date: 06/09/2024 CLINICAL DATA:  Unresponsiveness. EXAM: CHEST  1 VIEW COMPARISON:  April 23, 2018 FINDINGS: The heart size and mediastinal contours are within normal limits. Both lungs are clear. Postoperative changes are seen within the visualized portion of the lumbar spine. Multilevel degenerative changes are present throughout the thoracic spine. IMPRESSION: No active cardiopulmonary disease. Electronically Signed   By: Suzen Dials M.D.   On: 06/09/2024 19:47   CT Head Wo  Contrast Result Date: 06/09/2024 CLINICAL DATA:  Altered mental status. EXAM: CT HEAD WITHOUT CONTRAST TECHNIQUE: Contiguous axial images were obtained from the base of the skull through the vertex without intravenous contrast. RADIATION DOSE REDUCTION: This exam was performed according to the departmental dose-optimization program which includes automated exposure control, adjustment of the mA and/or kV according to patient size and/or use of iterative reconstruction technique. COMPARISON:  None Available. FINDINGS: Brain: No evidence of acute infarction, hemorrhage, hydrocephalus, extra-axial collection or mass lesion/mass effect. Vascular: No hyperdense vessel or unexpected calcification. Skull: Normal.  Negative for fracture or focal lesion. Sinuses/Orbits: No acute finding. Other: None. IMPRESSION: No acute intracranial pathology. Electronically Signed   By: Suzen Dials M.D.   On: 06/09/2024 19:45     Procedures   Medications Ordered in the ED - No data to display                                  Medical Decision Making Cardiac monitor interpretation: Sinus rhythm, no ectopy  Patient here for what is likely a syncopal episode.  Was hypotensive on EMS arrival but blood pressure came up with some fluids.  I did not have to give her more here she had stable blood pressures.  Able to ambulate around the department and had no vital sign abnormalities while here.  Workup negative including negative lab work and CT head.  Family at bedside will plan to take her home.  Patient is feeling better would like to go home at this time.  Advise close up with primary care doctor otherwise return to the ER for new or worsening symptoms.  She feels comfortable with the plan.  All results and plan discussed with patient and family at bedside.  Problems Addressed: Syncope, unspecified syncope type: acute illness or injury  Amount and/or Complexity of Data Reviewed Independent Historian: EMS    Details:  EMS helps remote history about patient's vital signs and state in which she was found by them External Data Reviewed: notes.    Details: Patient records reviewed and patient seen in the office recently for prediabetes Labs: ordered. Decision-making details documented in ED Course.    Details: Ordered and reviewed by me and unremarkable Radiology: ordered and independent interpretation performed. Decision-making details documented in ED Course.    Details: Ordered And interpreted by me independently radiology CT head: Shows no acute abnormality Chest x-ray: Shows no acute abnormality in the chest ECG/medicine tests: ordered and independent interpretation performed. Decision-making details documented in ED Course.    Details: Ordered and interpreted by me in the absence of cardiology and shows sinus rhythm, no STEMI or acute change when compared to prior EKG  Risk OTC drugs. Prescription drug management. Drug therapy requiring intensive monitoring for toxicity.     Final diagnoses:  Syncope, unspecified syncope type    ED Discharge Orders     None          Gennaro Duwaine CROME, DO 06/09/24 2325
